# Patient Record
Sex: Female | Born: 1955
Health system: Southern US, Community
[De-identification: ages and names within clinical notes are randomized; demographics above are authoritative.]

## PROBLEM LIST (undated history)

## (undated) DIAGNOSIS — E785 Hyperlipidemia, unspecified: Secondary | ICD-10-CM

## (undated) DIAGNOSIS — Z98811 Dental restoration status: Secondary | ICD-10-CM

## (undated) DIAGNOSIS — R079 Chest pain, unspecified: Secondary | ICD-10-CM

## (undated) DIAGNOSIS — Q231 Congenital insufficiency of aortic valve: Secondary | ICD-10-CM

## (undated) DIAGNOSIS — I359 Nonrheumatic aortic valve disorder, unspecified: Secondary | ICD-10-CM

## (undated) DIAGNOSIS — M65342 Trigger finger, left ring finger: Secondary | ICD-10-CM

## (undated) DIAGNOSIS — T7840XA Allergy, unspecified, initial encounter: Secondary | ICD-10-CM

## (undated) DIAGNOSIS — I10 Essential (primary) hypertension: Secondary | ICD-10-CM

## (undated) DIAGNOSIS — I2510 Atherosclerotic heart disease of native coronary artery without angina pectoris: Secondary | ICD-10-CM

## (undated) DIAGNOSIS — Q2381 Bicuspid aortic valve: Secondary | ICD-10-CM

## (undated) DIAGNOSIS — F419 Anxiety disorder, unspecified: Secondary | ICD-10-CM

## (undated) HISTORY — DX: Allergy, unspecified, initial encounter: T78.40XA

## (undated) HISTORY — DX: Hyperlipidemia, unspecified: E78.5

## (undated) HISTORY — DX: Anxiety disorder, unspecified: F41.9

## (undated) HISTORY — DX: Essential (primary) hypertension: I10

## (undated) HISTORY — PX: CHOLECYSTECTOMY: SHX55

## (undated) HISTORY — PX: ROTATOR CUFF REPAIR: SHX139

## (undated) HISTORY — PX: ABDOMINAL HYSTERECTOMY: SHX81

## (undated) HISTORY — PX: APPENDECTOMY: SHX54

---

## 1998-08-25 ENCOUNTER — Ambulatory Visit (HOSPITAL_COMMUNITY): Admission: RE | Admit: 1998-08-25 | Discharge: 1998-08-25 | Payer: Self-pay | Admitting: Surgery

## 1998-11-25 ENCOUNTER — Other Ambulatory Visit: Admission: RE | Admit: 1998-11-25 | Discharge: 1998-11-25 | Payer: Self-pay | Admitting: Obstetrics and Gynecology

## 2001-12-29 ENCOUNTER — Other Ambulatory Visit: Admission: RE | Admit: 2001-12-29 | Discharge: 2001-12-29 | Payer: Self-pay | Admitting: Obstetrics and Gynecology

## 2003-08-29 ENCOUNTER — Emergency Department (HOSPITAL_COMMUNITY): Admission: EM | Admit: 2003-08-29 | Discharge: 2003-08-29 | Payer: Self-pay | Admitting: Emergency Medicine

## 2004-04-09 ENCOUNTER — Ambulatory Visit (HOSPITAL_BASED_OUTPATIENT_CLINIC_OR_DEPARTMENT_OTHER): Admission: RE | Admit: 2004-04-09 | Discharge: 2004-04-09 | Payer: Self-pay | Admitting: Orthopedic Surgery

## 2004-04-09 HISTORY — PX: FOOT GANGLION EXCISION: SHX1660

## 2004-04-28 ENCOUNTER — Observation Stay (HOSPITAL_COMMUNITY): Admission: RE | Admit: 2004-04-28 | Discharge: 2004-04-29 | Payer: Self-pay | Admitting: Surgery

## 2004-04-28 HISTORY — PX: INCISIONAL HERNIA REPAIR: SHX193

## 2005-04-14 ENCOUNTER — Other Ambulatory Visit: Admission: RE | Admit: 2005-04-14 | Discharge: 2005-04-14 | Payer: Self-pay | Admitting: Obstetrics and Gynecology

## 2006-02-02 ENCOUNTER — Ambulatory Visit (HOSPITAL_BASED_OUTPATIENT_CLINIC_OR_DEPARTMENT_OTHER): Admission: RE | Admit: 2006-02-02 | Discharge: 2006-02-02 | Payer: Self-pay | Admitting: Orthopedic Surgery

## 2006-02-02 HISTORY — PX: OLECRANON BURSA EXCISION: SUR541

## 2006-07-13 ENCOUNTER — Ambulatory Visit (HOSPITAL_BASED_OUTPATIENT_CLINIC_OR_DEPARTMENT_OTHER): Admission: RE | Admit: 2006-07-13 | Discharge: 2006-07-13 | Payer: Self-pay | Admitting: Orthopedic Surgery

## 2006-07-13 HISTORY — PX: MINOR IRRIGATION AND DEBRIDEMENT OF WOUND: SHX6239

## 2007-04-07 ENCOUNTER — Encounter: Payer: Self-pay | Admitting: Cardiovascular Disease

## 2007-04-21 ENCOUNTER — Encounter: Payer: Self-pay | Admitting: Cardiovascular Disease

## 2008-05-22 ENCOUNTER — Encounter: Admission: RE | Admit: 2008-05-22 | Discharge: 2008-05-22 | Payer: Self-pay | Admitting: Surgery

## 2009-01-01 ENCOUNTER — Encounter: Admission: RE | Admit: 2009-01-01 | Discharge: 2009-01-01 | Payer: Self-pay | Admitting: Obstetrics and Gynecology

## 2009-05-06 ENCOUNTER — Ambulatory Visit: Payer: Self-pay | Admitting: Internal Medicine

## 2009-05-30 ENCOUNTER — Ambulatory Visit: Payer: Self-pay | Admitting: Internal Medicine

## 2010-06-11 ENCOUNTER — Ambulatory Visit: Payer: Self-pay | Admitting: Cardiovascular Disease

## 2011-01-01 NOTE — Op Note (Signed)
NAMETarini, Erin [name]                  ACCOUNT NO.:  [id number]   MEDICAL RECORD NO.:  [id number]          PATIENT TYPE:  AMB   LOCATION:  DSC                          FACILITY:  [facility]   PHYSICIAN:  Loreta Ave, M.D. DATE OF BIRTH:  1955/10/12   DATE OF PROCEDURE:  07/13/2006  DATE OF DISCHARGE:                               OPERATIVE REPORT   PREOPERATIVE DIAGNOSIS:  Persistent stitch abscess, left elbow, after  olecranon bursectomy performed June 2007.   POSTOPERATIVE DIAGNOSIS:  Persistent stitch abscess, left elbow, after  olecranon bursectomy performed June 2007, with minimal amount of  purulence, but an abundant amount of subcutaneous scar and granulation  tissue.   PROCEDURES PERFORMED:  Exploration, drainage, debridement of left elbow  wound with excision of stitch abscess and sinus tract.  Irrigation and  loose primary closure.   SURGEON:  Loreta Ave, M.D.   ASSISTANT:  Zonia Kief, P.A.   ANESTHESIA:  General.   ESTIMATED BLOOD LOSS:  Minimal.   TOURNIQUET TIME:  30 minutes.   SPECIMENS:  None.   CULTURES:  Aerobic and anaerobic culture of the scant amount of purulent  material was sent.   DRESSINGS:  Soft compressive.   COMPLICATIONS:  None.   DESCRIPTION OF PROCEDURE:  The patient was brought to the operating room  and after adequate anesthesia had been obtained, the left arm was  prepped and draped in the usual sterile fashion.  An elliptical incision  of the sinus tract yielding a scant amount of about 1 mL of purulent  material and retained stitch that never resorbed.  The arm was  exsanguinated and tourniquet inflated to allow visualization.  The  proximal half of the wound was opened.  Although a lot of subcutaneous  granulation tissue was found with adhesions, there was no further  purulence.  Nothing deep.  All of the abnormal tissue was completely  excised.  The margins of the wound were debrided back to  healthy tissue.  Thoroughly  irrigated with saline.  Loose primary  closure with nylon.  A sterile compressive dressing was applied.  Tourniquet was deflated and removed.  Anesthesia reversed.  Brought to  the recovery room.  Tolerated the surgery with no complications.      Loreta Ave, M.D.  Electronically Signed     DFM/MEDQ  D:  07/13/2006  T:  07/14/2006  Job:  [id number]

## 2011-01-01 NOTE — Op Note (Signed)
NAMEBrianna [name], Erin [name]                            ACCOUNT NO.:  [id number]   MEDICAL RECORD NO.:  [id number]                   PATIENT TYPE:  AMB   LOCATION:  DSC                                  FACILITY:  [facility]   PHYSICIAN:  Loreta Ave, M.D.              DATE OF BIRTH:  [birth year]   DATE OF PROCEDURE:  04/09/2004  DATE OF DISCHARGE:                                 OPERATIVE REPORT   PREOPERATIVE DIAGNOSIS:  Symptomatic ganglion, dorsal lateral aspect of left  foot.   POSTOPERATIVE DIAGNOSIS:  Ganglion arising from the fourth tarsometatarsal  joint dorsally.   PROCEDURE:  Excision of ganglion dorsal lateral aspect of left foot.   SURGEON:  Loreta Ave, M.D.   ASSISTANT:  Arlys John D. Petrarca, P.A.-C.   ANESTHESIA:  General.   ESTIMATED BLOOD LOSS:  Minimal.   TOURNIQUET TIME:  30 minutes.   SPECIMENS:  Excised ganglion.   CULTURES:  None.   COMPLICATIONS:  None.   DRESSING:  Sterile compressive dressing.   DESCRIPTION OF PROCEDURE:  The patient was brought to the operating room and  placed on the operative table in the supine position.  After adequate  anesthesia had been obtained, the tourniquet was applied and prepped and  draped in the usual sterile fashion.  Exsanguination with elevation of the  Esmarch and the Esmarch was left as a tourniquet at the ankle.  The ganglion  which was 2 cm in diameter coming off the dorsal lateral aspect of the foot  was approached through an oblique incision in line with the skin folds.  The  skin and subcutaneous tissues were divided.  Careful dissection of the  ganglion protecting the neurovascular structures. There had been  preoperative irritation of the sural nerve which was at the margin of the  ganglion, protected, identified and not injured.  The ganglion was excised  in its entirety tracking all the way down to the joint capsule between the  fourth tarsometatarsal joint.  A small window was made in the capsule so the  neck of the ganglion was removed.  All recesses were examined to be sure all  fragments were removed.  The wound was irrigated.  The wound was closed with  Vicryl and  then nylon.  It was then injected with Marcaine and a sterile compressive  dressing was applied.  The Esmarch was removed and the anesthesia was  reversed.  The patient was brought to the recovery room and tolerated the  surgery well with no complications.                                               Loreta Ave, M.D.    DFM/MEDQ  D:  04/09/2004  T:  04/09/2004  Job:  [id number]

## 2011-01-01 NOTE — Op Note (Signed)
NAMETyffani, Foglesong Chiquita                  ACCOUNT NO.:  [id number]   MEDICAL RECORD NO.:  [id number]          PATIENT TYPE:  AMB   LOCATION:  DSC                          FACILITY:  [facility]   PHYSICIAN:  Loreta Ave, M.D. DATE OF BIRTH:  [birth year]   DATE OF PROCEDURE:  02/02/2006  DATE OF DISCHARGE:                                 OPERATIVE REPORT   PREOPERATIVE DIAGNOSIS:  Olecranon bursitis with chondral loose bodies in  the olecranon bursa, left elbow.   POSTOPERATIVE DIAGNOSIS:  Olecranon bursitis with chondral loose bodies in  the olecranon bursa, left elbow.   OPERATIVE PROCEDURE:  Excision of olecranon bursa, left elbow.   SURGEON:  Loreta Ave, M.D.   ASSISTANT:  Genene Churn. Denton Meek.   ANESTHESIA:  General.   ESTIMATED BLOOD LOSS:  Minimal.   TOURNIQUET TIME:  40 minutes.   SPECIMENS:  None.   CULTURES:  None.   COMPLICATIONS:  None.   DRESSING:  Soft compressive.   PROCEDURE:  Patient brought to the operating room and after adequate  anesthesia had been obtained, tourniquet applied to the upper aspect of the  left arm.  Prepped and draped in the usual sterile fashion.  Exsanguinated  with elevation and Esmarch, tourniquet inflated to 250 mmHg.  A longitudinal  incision curved medially around the tip of the elbow.  Skin and subcutaneous  tissue divided.  The ulnar nerve identified and protected throughout.  The  bursa, which was not that swollen today, was excised in its entirety,  including the fibrinous and chondral debris within the bursa.  Once  completely excised, the entire area was explored.  Triceps tendon intact.  A  little prominence and roughness of the olecranon underneath this, which was  smoothed off with rongeurs to a nice smooth surface so there  would be nothing prominent.  Wound irrigated and then closed with  subcutaneous and subcuticular Vicryl and Steri-Strips.  Sterile compressive  dressing with a compressive wrap applied.  Tourniquet  deflated.  Anesthesia  reversed.  Brought to the recovery room.  Tolerated the surgery well with no  complications.      Loreta Ave, M.D.  Electronically Signed     DFM/MEDQ  D:  02/02/2006  T:  02/02/2006  Job:  [id number]

## 2011-01-01 NOTE — Op Note (Signed)
NAMEJanequa, Erin [name]                            ACCOUNT NO.:  [id number]   MEDICAL RECORD NO.:  [id number]                   PATIENT TYPE:  OBV   LOCATION:  0098                                 FACILITY:  [facility]   PHYSICIAN:  Thornton Park. Daphine Deutscher, M.D.             DATE OF BIRTH:  10/01/1955   DATE OF PROCEDURE:  04/28/2004  DATE OF DISCHARGE:                                 OPERATIVE REPORT   PREOPERATIVE DIAGNOSIS:  Right sided right upper quadrant abdominal pain in  the site of previous incision.   POSTOPERATIVE DIAGNOSIS:  Right upper quadrant ventral incisional hernia.   PROCEDURE:  Laparoscopic enterolysis and laparoscopic repair of hernia with  Pyratec mesh.   SURGEON:  Thornton Park. Daphine Deutscher, M.D.   ANESTHESIA:  General.   DESCRIPTION OF PROCEDURE:  Erin [name] was taken to room 6 at [facility] on  April 28, 2004, and given general anesthesia.  The abdomen was entered  using an Optiview trocar with 0 degree 10 mm scope through the left upper  quadrant in an oblique fashion.  Pneumoperitoneum was established.  Using  the Harmonic scalpel and two more 5 mm ports in the lower abdomen, I  proceeded to do an enterolysis of adhesions to a previous mesh repair in the  midline.  As I took down the adhesions, I saw an area that appeared to be  torn into the muscle in the lateral most part of the wound exactly where she  hurt.  I felt this was a likely source of her pain.  In addition, in that  area, I took down some adhesions using the Harmonic scalpel between the  colon and small bowel which were nice and clean cut, easy to see adhesions.  I used some Pyratec mesh and cut it into a circle.  It was 3 inches in  diameter.  I then fixed four sutures at the perimeter and one in the middle.  I then proceeded to mark these on the skin and then introduced the Pyratec  mesh after soaking it and then pulling up the 0 Prolene sutures and thing  them down.  The remaining portion of the mesh which  lay in there nicely was  tacked with a tacker.  Everything else in the abdomen appeared to be in  order.  The abdomen was deflated and the trocars withdrawn.  The patient  tolerated the procedure well and was taken to the recovery room in  satisfactory condition.  She will be admitted for overnight observation.                                               Thornton Park Daphine Deutscher, M.D.    MBM/MEDQ  D:  04/28/2004  T:  04/28/2004  Job:  [id number]

## 2011-06-21 ENCOUNTER — Encounter: Payer: Self-pay | Admitting: Cardiovascular Disease

## 2011-06-23 ENCOUNTER — Encounter: Payer: Self-pay | Admitting: Cardiovascular Disease

## 2011-06-23 ENCOUNTER — Ambulatory Visit (INDEPENDENT_AMBULATORY_CARE_PROVIDER_SITE_OTHER): Payer: 59 | Admitting: Cardiovascular Disease

## 2011-06-23 DIAGNOSIS — E785 Hyperlipidemia, unspecified: Secondary | ICD-10-CM

## 2011-06-23 DIAGNOSIS — R011 Cardiac murmur, unspecified: Secondary | ICD-10-CM

## 2011-06-23 MED ORDER — ROSUVASTATIN CALCIUM 5 MG PO TABS: 5.0000 mg | ORAL_TABLET | Freq: Every day | ORAL | Status: DC

## 2011-06-23 NOTE — Progress Notes (Signed)
Erin [name] Date of Birth  02-14-1956  [facility] [address]    [address] [place], [place]  [address] [phone]  Fax  [phone]  History of Present Illness:  Erin [name] is a 55 with a hx of hyperlipidemia and a functional bicuspid aortic valve.  She has done well. Denies any chest pain or dyspnea.    Current Outpatient Prescriptions on File Prior to Visit  Medication Sig Dispense Refill  . aspirin 81 MG tablet Take 81 mg by mouth. Twice a Week       . atorvastatin (LIPITOR) 20 MG tablet Take 20 mg by mouth daily.          No Known Allergies  Past Medical History  Diagnosis Date  . Chest pain     nagetive stress test 04/21/2007  . Hyperlipidemia   . Hypertension   . Bicuspid aortic valve     Functional bicuspid aortic valve    No past surgical history on file.  History  Smoking status  . Former Smoker  . Quit date: 06/11/2010  Smokeless tobacco  . Not on file    History  Alcohol Use No    No family history on file.  Reviw of Systems:  Reviewed in the HPI.  All other systems are negative.  Physical Exam: BP 134/82  Pulse 73  Ht 5\' 3"  (1.6 m)  Wt 136 lb 6.4 oz (61.871 kg)  BMI 24.16 kg/m2 The patient is alert and oriented x 3.  The mood and affect are normal.   Skin: warm and dry.  Color is normal.    HEENT:   Normocephalic/atraumatic. The mucous membranes are moist. Her carotids are 2+ without bruits.  Lungs: Her lungs are clear.   Heart: Regular rate, S1-S2. I did not hear a murmur today   Abdomen: + BS, non tender, no HSM  Extremities:  No c/c/e  Neuro:  Non focal, gait is normal.    ECG: Normal sinus rhythm, normal EKG  Assessment / Plan:

## 2011-06-23 NOTE — Assessment & Plan Note (Addendum)
She's had lots of muscle aches ever since she started Lipitor. We'll try her on Crestor 5 mg a day. We'll check her labs in 3 months. I'll see her again in 1 year.

## 2011-06-23 NOTE — Patient Instructions (Addendum)
Your physician recommends that you return for a FASTING lipid profile: 3 months/ app set   Your physician wants you to follow-up in: 12  months  You will receive a reminder letter in the mail two months in advance. If you don't receive a letter, please call our office to schedule the follow-up appointment.   Your physician has recommended you make the following change in your medication:   1) stop lipitor  2) start crestor 5 mg

## 2011-09-28 ENCOUNTER — Other Ambulatory Visit (INDEPENDENT_AMBULATORY_CARE_PROVIDER_SITE_OTHER): Payer: 59 | Admitting: *Deleted

## 2011-09-28 DIAGNOSIS — E785 Hyperlipidemia, unspecified: Secondary | ICD-10-CM

## 2011-09-28 LAB — LIPID PANEL
Cholesterol: 199 mg/dL (ref 0–200)
HDL: 54.4 mg/dL (ref >39.00)
Triglycerides: 95 mg/dL (ref 0.0–149.0)
VLDL: 19 mg/dL (ref 0.0–40.0)

## 2011-09-28 LAB — HEPATIC FUNCTION PANEL
ALT: 20 U/L (ref 0–35)
AST: 18 U/L (ref 0–37)
Albumin: 3.9 g/dL (ref 3.5–5.2)
Alkaline Phosphatase: 105 U/L (ref 39–117)
Total Bilirubin: 0.5 mg/dL (ref 0.3–1.2)
Total Protein: 6.8 g/dL (ref 6.0–8.3)

## 2011-09-28 LAB — BASIC METABOLIC PANEL
BUN: 16 mg/dL (ref 6–23)
Calcium: 9.2 mg/dL (ref 8.4–10.5)

## 2011-10-21 ENCOUNTER — Telehealth: Payer: Self-pay | Admitting: *Deleted

## 2011-10-21 NOTE — Telephone Encounter (Signed)
Patient called with lab results. msg left 

## 2013-07-06 ENCOUNTER — Ambulatory Visit (INDEPENDENT_AMBULATORY_CARE_PROVIDER_SITE_OTHER): Payer: 59 | Admitting: Cardiovascular Disease

## 2013-07-06 ENCOUNTER — Encounter: Payer: Self-pay | Admitting: Cardiovascular Disease

## 2013-07-06 VITALS — BP 142/80 | HR 65 | Ht 63.0 in | Wt 142.0 lb

## 2013-07-06 DIAGNOSIS — I359 Nonrheumatic aortic valve disorder, unspecified: Secondary | ICD-10-CM

## 2013-07-06 DIAGNOSIS — E785 Hyperlipidemia, unspecified: Secondary | ICD-10-CM

## 2013-07-06 DIAGNOSIS — I350 Nonrheumatic aortic (valve) stenosis: Secondary | ICD-10-CM | POA: Insufficient documentation

## 2013-07-06 DIAGNOSIS — R0989 Other specified symptoms and signs involving the circulatory and respiratory systems: Secondary | ICD-10-CM

## 2013-07-06 NOTE — Assessment & Plan Note (Signed)
  Erin [name]  has a history of very mild aortic stenosis. It appears that she has fusion of 2 of her cusp but not a true bicuspid aortic valve.  We'll get a repeat echocardiogram for further evaluation. She's not having any symptoms at this point.

## 2013-07-06 NOTE — Assessment & Plan Note (Signed)
He has a history of cigarette smoking. She now has a right carotid bruit. We will check a carotid duplex scan. Her cholesterol levels have been managed by Dr. Waynard Edwards

## 2013-07-06 NOTE — Progress Notes (Signed)
Erin [name] Date of Birth  Dec 22, 1955 [facility] [address]    [address] [place], [place]  [address] [phone]  Fax  [phone]  History of Present Illness:  Erin [name] is a 89 with a hx of hyperlipidemia and a functional bicuspid aortic valve.  She has done well. Denies any chest pain or dyspnea.    Nov. 21, 2014:  Erin [name] is ding ok.  She is taking Crestor and not the mevacor as listed in med list.  No CP,  Anxious about her BP;.  She avoids salt .  BP is typicall 140s / 80's.   Current Outpatient Prescriptions  Medication Sig Dispense Refill  . aspirin 81 MG tablet Take 81 mg by mouth. Twice a Week       . rosuvastatin (CRESTOR) 20 MG tablet Take 20 mg by mouth daily.      Marland Kitchen lovastatin (MEVACOR) 40 MG tablet Take 40 mg by mouth at bedtime.       No current facility-administered medications for this visit.     Allergies  Allergen Reactions  . Lipitor [Atorvastatin Calcium]     Muscle aches    Past Medical History  Diagnosis Date  . Chest pain     nagetive stress test 04/21/2007  . Hyperlipidemia   . Hypertension   . Bicuspid aortic valve     Functional bicuspid aortic valve    No past surgical history on file.  History  Smoking status  . Former Smoker  . Quit date: 06/11/2010  Smokeless tobacco  . Not on file    History  Alcohol Use No    No family history on file.  Reviw of Systems:  Reviewed in the HPI.  All other systems are negative.  Physical Exam: BP 142/80  Pulse 65  Ht 5\' 3"  (1.6 m)  Wt 142 lb (64.411 kg)  BMI 25.16 kg/m2 The patient is alert and oriented x 3.  The mood and affect are normal.   Skin: warm and dry.  Color is normal.    HEENT:   Normocephalic/atraumatic. The mucous membranes are moist. Her carotids are 2+ without bruits.  Lungs: Her lungs are clear.   Heart: Regular rate, S1-S2. I did not hear a murmur today   Abdomen: + BS, non tender, no HSM  Extremities:  No c/c/e  Neuro:  Non focal, gait is normal.     ECG: Nov. 21, 2014:  Normal sinus rhythm at 65 , normal EKG  Assessment / Plan:

## 2013-07-06 NOTE — Patient Instructions (Signed)
Your physician has requested that you have an echocardiogram. Echocardiography is a painless test that uses sound waves to create images of your heart. It provides your doctor with information about the size and shape of your heart and how well your heart's chambers and valves are working. This procedure takes approximately one hour. There are no restrictions for this procedure.  Your physician has requested that you have a carotid duplex. This test is an ultrasound of the carotid arteries in your neck. It looks at blood flow through these arteries that supply the brain with blood. Allow one hour for this exam. There are no restrictions or special instructions.   Your physician wants you to follow-up in: 1 year You will receive a reminder letter in the mail two months in advance. If you don't receive a letter, please call our office to schedule the follow-up appointment.   Your physician recommends that you continue on your current medications as directed. Please refer to the Current Medication list given to you today.

## 2013-07-06 NOTE — Assessment & Plan Note (Signed)
Managed by Dr. Perini. 

## 2013-07-17 ENCOUNTER — Ambulatory Visit (HOSPITAL_COMMUNITY): Payer: 59 | Attending: Cardiovascular Disease

## 2013-07-17 DIAGNOSIS — Q251 Coarctation of aorta: Secondary | ICD-10-CM | POA: Insufficient documentation

## 2013-07-17 DIAGNOSIS — I6529 Occlusion and stenosis of unspecified carotid artery: Secondary | ICD-10-CM

## 2013-07-17 DIAGNOSIS — R0989 Other specified symptoms and signs involving the circulatory and respiratory systems: Secondary | ICD-10-CM

## 2013-07-17 DIAGNOSIS — E785 Hyperlipidemia, unspecified: Secondary | ICD-10-CM

## 2013-07-17 DIAGNOSIS — I350 Nonrheumatic aortic (valve) stenosis: Secondary | ICD-10-CM

## 2013-07-23 ENCOUNTER — Ambulatory Visit (HOSPITAL_COMMUNITY): Payer: 59 | Attending: Cardiovascular Disease | Admitting: Radiology

## 2013-07-23 DIAGNOSIS — Z87891 Personal history of nicotine dependence: Secondary | ICD-10-CM | POA: Insufficient documentation

## 2013-07-23 DIAGNOSIS — I359 Nonrheumatic aortic valve disorder, unspecified: Secondary | ICD-10-CM | POA: Insufficient documentation

## 2013-07-23 DIAGNOSIS — I379 Nonrheumatic pulmonary valve disorder, unspecified: Secondary | ICD-10-CM | POA: Insufficient documentation

## 2013-07-23 DIAGNOSIS — E785 Hyperlipidemia, unspecified: Secondary | ICD-10-CM | POA: Insufficient documentation

## 2013-07-23 DIAGNOSIS — I059 Rheumatic mitral valve disease, unspecified: Secondary | ICD-10-CM | POA: Insufficient documentation

## 2013-07-23 DIAGNOSIS — Q231 Congenital insufficiency of aortic valve: Secondary | ICD-10-CM | POA: Insufficient documentation

## 2013-07-23 DIAGNOSIS — I350 Nonrheumatic aortic (valve) stenosis: Secondary | ICD-10-CM

## 2013-07-23 DIAGNOSIS — I079 Rheumatic tricuspid valve disease, unspecified: Secondary | ICD-10-CM | POA: Insufficient documentation

## 2013-07-23 DIAGNOSIS — R079 Chest pain, unspecified: Secondary | ICD-10-CM | POA: Insufficient documentation

## 2013-07-23 DIAGNOSIS — R0989 Other specified symptoms and signs involving the circulatory and respiratory systems: Secondary | ICD-10-CM

## 2013-07-23 NOTE — Progress Notes (Signed)
Echocardiogram performed.  

## 2013-10-23 ENCOUNTER — Encounter: Payer: Self-pay | Admitting: Cardiovascular Disease

## 2014-07-09 ENCOUNTER — Ambulatory Visit (INDEPENDENT_AMBULATORY_CARE_PROVIDER_SITE_OTHER): Payer: 59 | Admitting: Cardiovascular Disease

## 2014-07-09 ENCOUNTER — Encounter: Payer: Self-pay | Admitting: Cardiovascular Disease

## 2014-07-09 VITALS — BP 164/96 | HR 64 | Ht 63.0 in | Wt 134.4 lb

## 2014-07-09 DIAGNOSIS — R0989 Other specified symptoms and signs involving the circulatory and respiratory systems: Secondary | ICD-10-CM

## 2014-07-09 DIAGNOSIS — I350 Nonrheumatic aortic (valve) stenosis: Secondary | ICD-10-CM

## 2014-07-09 DIAGNOSIS — E785 Hyperlipidemia, unspecified: Secondary | ICD-10-CM

## 2014-07-09 NOTE — Progress Notes (Signed)
Erin [name] Date of Birth  [birth year] [facility] [address]    [place], [place]  [address] [phone]  Fax  [phone]  History of Present Illness:  Erin [name] is a 43 with a hx of hyperlipidemia and a functional bicuspid aortic valve.  She has done well. Denies any chest pain or dyspnea.    Nov. 21, 2014:  Erin [name] is ding ok.  She is taking Crestor and not the mevacor as listed in med list.  No CP,  Anxious about her BP;.  She avoids salt .  BP is typicall 140s / 80's.  Nov. 24, 2015:  BP is a bit high.   Not eating any extra salt.   Has been taking some cold meds recently.  Has been exercising twice a week.   Boot camp.  No CP or dyspnea.   Current Outpatient Prescriptions  Medication Sig Dispense Refill  . aspirin 81 MG tablet Take 81 mg by mouth. Twice a Week     . benazepril (LOTENSIN) 40 MG tablet Take 40 mg by mouth daily.    . rosuvastatin (CRESTOR) 20 MG tablet Take 20 mg by mouth daily.     No current facility-administered medications for this visit.     Allergies  Allergen Reactions  . Lipitor [Atorvastatin Calcium]     Muscle aches    Past Medical History  Diagnosis Date  . Chest pain     nagetive stress test 04/21/2007  . Hyperlipidemia   . Hypertension   . Bicuspid aortic valve     Functional bicuspid aortic valve    No past surgical history on file.  History  Smoking status  . Former Smoker  . Quit date: 06/11/2010  Smokeless tobacco  . Not on file    History  Alcohol Use No    No family history on file.  Reviw of Systems:  Reviewed in the HPI.  All other systems are negative.  Physical Exam: BP 164/96 mmHg  Pulse 64  Ht 5\' 3"  (1.6 m)  Wt 134 lb 6.4 oz (60.963 kg)  BMI 23.81 kg/m2 The patient is alert and oriented x 3.  The mood and affect are normal.   Skin: warm and dry.  Color is normal.    HEENT:   Normocephalic/atraumatic. The mucous membranes are moist. Her carotids are 2+ without bruits.  Lungs: Her  lungs are clear.   Heart: Regular rate, S1-S2. I did not hear a murmur today   Abdomen: + BS, non tender, no HSM  Extremities:  No c/c/e  Neuro:  Non focal, gait is normal.    ECG: Nov. 24, 2015  :  NSR at 43. Possible LAE.   Assessment / Plan:

## 2014-07-09 NOTE — Assessment & Plan Note (Signed)
She has only mild carotid plaque. She has not significant bruitt on exam. OK to wait until next year to do her follow up  Carotid Duplex

## 2014-07-09 NOTE — Assessment & Plan Note (Signed)
Her last lipid profile was at Dr. Silvestre Mesi office.   He increased her crestor. He will follow up with labs again this March.

## 2014-07-09 NOTE — Patient Instructions (Signed)
Your physician recommends that you continue on your current medications as directed. Please refer to the Current Medication list given to you today.  Your physician wants you to follow-up in: 1 year with Dr. Nahser.  You will receive a reminder letter in the mail two months in advance. If you don't receive a letter, please call our office to schedule the follow-up appointment.  

## 2015-02-03 ENCOUNTER — Encounter: Payer: Self-pay | Admitting: Cardiovascular Disease

## 2015-07-16 ENCOUNTER — Encounter: Payer: Self-pay | Admitting: Cardiovascular Disease

## 2015-07-16 ENCOUNTER — Ambulatory Visit (INDEPENDENT_AMBULATORY_CARE_PROVIDER_SITE_OTHER): Payer: 59 | Admitting: Cardiovascular Disease

## 2015-07-16 VITALS — BP 134/100 | HR 59 | Ht 63.0 in | Wt 121.6 lb

## 2015-07-16 DIAGNOSIS — E785 Hyperlipidemia, unspecified: Secondary | ICD-10-CM

## 2015-07-16 DIAGNOSIS — R0989 Other specified symptoms and signs involving the circulatory and respiratory systems: Secondary | ICD-10-CM | POA: Diagnosis not present

## 2015-07-16 NOTE — Progress Notes (Signed)
Erin [name] Date of Birth  28-Dec-1955 [facility] [address]    [place], [place]  [address] [phone]  Fax  [phone]  Problem list 1. Hyperlipidemia 2. Functional bicuspid aortic valve  History of Present Illness:  Erin [name] is a 58 with a hx of hyperlipidemia and a functional bicuspid aortic valve.  She has done well. Denies any chest pain or dyspnea.    Nov. 21, 2014:  Erin [name] is ding ok.  She is taking Crestor and not the mevacor as listed in med list.  No CP,  Anxious about her BP;.  She avoids salt .  BP is typicall 140s / 80's.  Nov. 24, 2015:  BP is a bit high.   Not eating any extra salt.   Has been taking some cold meds recently.  Has been exercising twice a week.   Boot camp.  No CP or dyspnea.   Nov. 30,  2016: Doing great. Boot camp twice a week .  Gets her labs at Dr. Silvestre Mesi office.    Current Outpatient Prescriptions  Medication Sig Dispense Refill  . aspirin 81 MG tablet Take 81 mg by mouth. Twice a Week     . benazepril (LOTENSIN) 40 MG tablet Take 40 mg by mouth daily.    . rosuvastatin (CRESTOR) 20 MG tablet Take 20 mg by mouth daily.    . valACYclovir (VALTREX) 1000 MG tablet Take 1 g by mouth daily as needed. AS NEEDED FOR COLD SORES     No current facility-administered medications for this visit.     Allergies  Allergen Reactions  . Lipitor [Atorvastatin Calcium]     Muscle aches    Past Medical History  Diagnosis Date  . Chest pain     nagetive stress test 04/21/2007  . Hyperlipidemia   . Hypertension   . Bicuspid aortic valve     Functional bicuspid aortic valve    No past surgical history on file.  History  Smoking status  . Former Smoker  . Quit date: 06/11/2010  Smokeless tobacco  . Not on file    History  Alcohol Use No    No family history on file.  Reviw of Systems:  Reviewed in the HPI.  All other systems are negative.  Physical Exam: BP 134/100 mmHg  Pulse 59  Ht 5\' 3"  (1.6 m)  Wt 121  lb 9.6 oz (55.157 kg)  BMI 21.55 kg/m2 The patient is alert and oriented x 3.  The mood and affect are normal.   Skin: warm and dry.  Color is normal.    HEENT:   Normocephalic/atraumatic. The mucous membranes are moist. Her carotids are 2+ without bruits.  Lungs: Her lungs are clear.   Heart: Regular rate, S1-S2. I did not hear a murmur today   Abdomen: + BS, non tender, no HSM  Extremities:  No c/c/e  Neuro:  Non focal, gait is normal.    ECG: Nov. 30, 2016:  Sinus brady at 1.  Normal ECG   Assessment / Plan:   1 . Carotid artery disease:  has a moderate bilateral carotid artery disease by Duplex scan.   Will repeat carotid duplex scan . Continue aggressive lipid control   2. Hyperlipidemia.   Continue Nutritional therapist .   Labs from Dr. Joylene Draft look ok.   Her LDL is 100. She has mild carotid artery disease so her goal LDL should be around 70. Encouraged her to eats less fat in her diet  We should consider adding Zetia to her medical regimine  Will see her in 1 year.     Luci Bellucci, Wonda Cheng, MD  07/16/2015 9:14 AM    [facility],  [place], [place]  [address] Pager [phone] Phone: [phone]; Fax: [phone]   [facility]  [address] [place], [place]  [address] [phone]   Fax [phone]

## 2015-07-16 NOTE — Patient Instructions (Signed)
Medication Instructions:   Your physician recommends that you continue on your current medications as directed. Please refer to the Current Medication list given to you today.   Testing/Procedures:  Your physician has requested that you have a carotid duplex. This test is an ultrasound of the carotid arteries in your neck. It looks at blood flow through these arteries that supply the brain with blood. Allow one hour for this exam. There are no restrictions or special instructions.    Follow-Up:  Your physician wants you to follow-up in: [place] will receive a reminder letter in the mail two months in advance. If you don't receive a letter, please call our office to schedule the follow-up appointment.     If you need a refill on your cardiac medications before your next appointment, please call your pharmacy.

## 2015-07-22 ENCOUNTER — Ambulatory Visit (HOSPITAL_COMMUNITY)
Admission: RE | Admit: 2015-07-22 | Discharge: 2015-07-22 | Disposition: A | Discharge status: Home / Self Care | Payer: 59 | Source: Ambulatory Visit | Attending: Cardiology | Admitting: Cardiology

## 2015-07-22 DIAGNOSIS — I6523 Occlusion and stenosis of bilateral carotid arteries: Secondary | ICD-10-CM | POA: Insufficient documentation

## 2015-07-22 DIAGNOSIS — E785 Hyperlipidemia, unspecified: Secondary | ICD-10-CM | POA: Diagnosis not present

## 2015-07-22 DIAGNOSIS — R0989 Other specified symptoms and signs involving the circulatory and respiratory systems: Secondary | ICD-10-CM | POA: Diagnosis not present

## 2015-07-22 DIAGNOSIS — I10 Essential (primary) hypertension: Secondary | ICD-10-CM | POA: Diagnosis not present

## 2015-10-15 DIAGNOSIS — M65342 Trigger finger, left ring finger: Secondary | ICD-10-CM

## 2015-10-15 HISTORY — DX: Trigger finger, left ring finger: M65.342

## 2015-11-06 ENCOUNTER — Encounter (HOSPITAL_BASED_OUTPATIENT_CLINIC_OR_DEPARTMENT_OTHER): Payer: Self-pay | Admitting: *Deleted

## 2015-11-10 ENCOUNTER — Other Ambulatory Visit: Payer: Self-pay | Admitting: Physician Assistant

## 2015-11-10 NOTE — H&P (Signed)
  Erin [name] comes in for follow up.  Recurrent triggering ring finger, left hand.  Injection lasted about eight weeks.  Recurrent triggering since then.  More and more intolerable.  She would like to discuss definitive treatment.   History and general exam is reviewed.    EXAMINATION: Lungs clear to auscultation bilaterally.  Heart sounds normal.  Specifically, obvious recurrent triggering A1 pulley, left ring finger.  Neurovascularly intact.    DISPOSITION:  We have talked about definitive treatment with an A1 pulley release.  I don't think further shots are going to help.  She understands and agrees.  Paperwork complete.  Set this up in the near future.    Ninetta Lights, M.D.

## 2015-11-13 ENCOUNTER — Encounter (HOSPITAL_BASED_OUTPATIENT_CLINIC_OR_DEPARTMENT_OTHER)
Admission: RE | Disposition: A | Discharge status: Home / Self Care | Payer: Self-pay | Source: Ambulatory Visit | Attending: Orthopedic Surgery

## 2015-11-13 ENCOUNTER — Ambulatory Visit (HOSPITAL_BASED_OUTPATIENT_CLINIC_OR_DEPARTMENT_OTHER)
Admission: RE | Admit: 2015-11-13 | Discharge: 2015-11-13 | Disposition: A | Discharge status: Home / Self Care | Payer: 59 | Source: Ambulatory Visit | Attending: Orthopedic Surgery | Admitting: Orthopedic Surgery

## 2015-11-13 ENCOUNTER — Ambulatory Visit (HOSPITAL_BASED_OUTPATIENT_CLINIC_OR_DEPARTMENT_OTHER): Payer: 59 | Admitting: Anesthesiology

## 2015-11-13 ENCOUNTER — Encounter (HOSPITAL_BASED_OUTPATIENT_CLINIC_OR_DEPARTMENT_OTHER): Payer: Self-pay

## 2015-11-13 DIAGNOSIS — M65342 Trigger finger, left ring finger: Secondary | ICD-10-CM | POA: Insufficient documentation

## 2015-11-13 DIAGNOSIS — Z87891 Personal history of nicotine dependence: Secondary | ICD-10-CM | POA: Diagnosis not present

## 2015-11-13 DIAGNOSIS — I739 Peripheral vascular disease, unspecified: Secondary | ICD-10-CM | POA: Diagnosis not present

## 2015-11-13 DIAGNOSIS — I10 Essential (primary) hypertension: Secondary | ICD-10-CM | POA: Diagnosis not present

## 2015-11-13 HISTORY — DX: Atherosclerotic heart disease of native coronary artery without angina pectoris: I25.10

## 2015-11-13 HISTORY — DX: Bicuspid aortic valve: Q23.81

## 2015-11-13 HISTORY — PX: TRIGGER FINGER RELEASE: SHX641

## 2015-11-13 HISTORY — DX: Dental restoration status: Z98.811

## 2015-11-13 HISTORY — DX: Trigger finger, left ring finger: M65.342

## 2015-11-13 HISTORY — DX: Congenital insufficiency of aortic valve: Q23.1

## 2015-11-13 SURGERY — RELEASE, A1 PULLEY, FOR TRIGGER FINGER
Anesthesia: General | Site: Finger | Laterality: Left

## 2015-11-13 MED ORDER — GLYCOPYRROLATE 0.2 MG/ML IJ SOLN: 0.2000 mg | Freq: Once | INTRAMUSCULAR | Status: DC | PRN

## 2015-11-13 MED ORDER — CEFAZOLIN SODIUM-DEXTROSE 2-4 GM/100ML-% IV SOLN
INTRAVENOUS | Status: AC
  Filled 2015-11-13: qty 100

## 2015-11-13 MED ORDER — MIDAZOLAM HCL 2 MG/2ML IJ SOLN
INTRAMUSCULAR | Status: AC
  Filled 2015-11-13: qty 2

## 2015-11-13 MED ORDER — DEXAMETHASONE SODIUM PHOSPHATE 10 MG/ML IJ SOLN
INTRAMUSCULAR | Status: AC
  Filled 2015-11-13: qty 1

## 2015-11-13 MED ORDER — FENTANYL CITRATE (PF) 100 MCG/2ML IJ SOLN
50.0000 ug | INTRAMUSCULAR | Status: DC | PRN
  Administered 2015-11-13: 100 ug via INTRAVENOUS

## 2015-11-13 MED ORDER — BUPIVACAINE HCL (PF) 0.5 % IJ SOLN
INTRAMUSCULAR | Status: DC | PRN
  Administered 2015-11-13: 10 mL

## 2015-11-13 MED ORDER — FENTANYL CITRATE (PF) 100 MCG/2ML IJ SOLN
INTRAMUSCULAR | Status: AC
  Filled 2015-11-13: qty 2

## 2015-11-13 MED ORDER — HYDROMORPHONE HCL 1 MG/ML IJ SOLN
0.2500 mg | INTRAMUSCULAR | Status: DC | PRN
  Administered 2015-11-13: 0.5 mg via INTRAVENOUS

## 2015-11-13 MED ORDER — CHLORHEXIDINE GLUCONATE 4 % EX LIQD: 60.0000 mL | Freq: Once | CUTANEOUS | Status: DC

## 2015-11-13 MED ORDER — LIDOCAINE HCL (CARDIAC) 20 MG/ML IV SOLN
INTRAVENOUS | Status: DC | PRN
  Administered 2015-11-13: 50 mg via INTRAVENOUS

## 2015-11-13 MED ORDER — HYDROCODONE-ACETAMINOPHEN 5-325 MG PO TABS
1.0000 | ORAL_TABLET | Freq: Once | ORAL | Status: AC
  Administered 2015-11-13: 1 via ORAL

## 2015-11-13 MED ORDER — ONDANSETRON HCL 4 MG/2ML IJ SOLN
INTRAMUSCULAR | Status: DC | PRN
Start: 2015-11-13 — End: 2015-11-13
  Administered 2015-11-13: 4 mg via INTRAVENOUS

## 2015-11-13 MED ORDER — HYDROCODONE-ACETAMINOPHEN 7.5-325 MG PO TABS: 1.0000 | ORAL_TABLET | Freq: Once | ORAL | Status: DC | PRN

## 2015-11-13 MED ORDER — PROMETHAZINE HCL 25 MG/ML IJ SOLN: 6.2500 mg | INTRAMUSCULAR | Status: DC | PRN

## 2015-11-13 MED ORDER — OXYCODONE-ACETAMINOPHEN 5-325 MG PO TABS: 1.0000 | ORAL_TABLET | ORAL | Status: DC | PRN

## 2015-11-13 MED ORDER — LIDOCAINE HCL (CARDIAC) 20 MG/ML IV SOLN
INTRAVENOUS | Status: AC
  Filled 2015-11-13: qty 5

## 2015-11-13 MED ORDER — LACTATED RINGERS IV SOLN: INTRAVENOUS | Status: DC

## 2015-11-13 MED ORDER — ONDANSETRON HCL 4 MG/2ML IJ SOLN
INTRAMUSCULAR | Status: AC
  Filled 2015-11-13: qty 2

## 2015-11-13 MED ORDER — ONDANSETRON HCL 4 MG PO TABS: 4.0000 mg | ORAL_TABLET | Freq: Three times a day (TID) | ORAL | Status: DC | PRN

## 2015-11-13 MED ORDER — SCOPOLAMINE 1 MG/3DAYS TD PT72: 1.0000 | MEDICATED_PATCH | Freq: Once | TRANSDERMAL | Status: DC | PRN

## 2015-11-13 MED ORDER — MIDAZOLAM HCL 2 MG/2ML IJ SOLN
1.0000 mg | INTRAMUSCULAR | Status: DC | PRN
  Administered 2015-11-13: 2 mg via INTRAVENOUS

## 2015-11-13 MED ORDER — CEFAZOLIN SODIUM-DEXTROSE 2-4 GM/100ML-% IV SOLN
2.0000 g | INTRAVENOUS | Status: AC
  Administered 2015-11-13: 2 g via INTRAVENOUS

## 2015-11-13 MED ORDER — HYDROMORPHONE HCL 1 MG/ML IJ SOLN
INTRAMUSCULAR | Status: AC
  Filled 2015-11-13: qty 1

## 2015-11-13 MED ORDER — PROPOFOL 10 MG/ML IV BOLUS
INTRAVENOUS | Status: DC | PRN
  Administered 2015-11-13: 175 mg via INTRAVENOUS

## 2015-11-13 MED ORDER — LACTATED RINGERS IV SOLN
INTRAVENOUS | Status: DC
  Administered 2015-11-13: 08:00:00 via INTRAVENOUS

## 2015-11-13 MED ORDER — DEXAMETHASONE SODIUM PHOSPHATE 4 MG/ML IJ SOLN
INTRAMUSCULAR | Status: DC | PRN
  Administered 2015-11-13: 10 mg via INTRAVENOUS

## 2015-11-13 MED ORDER — HYDROCODONE-ACETAMINOPHEN 5-325 MG PO TABS
ORAL_TABLET | ORAL | Status: AC
  Filled 2015-11-13: qty 1

## 2015-11-13 SURGICAL SUPPLY — 47 items
BANDAGE ACE 3X5.8 VEL STRL LF (GAUZE/BANDAGES/DRESSINGS) ×2 IMPLANT
BLADE SURG 15 STRL LF DISP TIS (BLADE) ×1 IMPLANT
BLADE SURG 15 STRL SS (BLADE) ×2
BNDG CMPR 9X4 STRL LF SNTH (GAUZE/BANDAGES/DRESSINGS) ×1
BNDG COHESIVE 3X5 TAN STRL LF (GAUZE/BANDAGES/DRESSINGS) ×2 IMPLANT
BNDG ESMARK 4X9 LF (GAUZE/BANDAGES/DRESSINGS) ×1 IMPLANT
CORDS BIPOLAR (ELECTRODE) IMPLANT
COVER BACK TABLE 60X90IN (DRAPES) ×2 IMPLANT
COVER MAYO STAND STRL (DRAPES) ×2 IMPLANT
CUFF TOURNIQUET SINGLE 18IN (TOURNIQUET CUFF) ×1 IMPLANT
DRAPE EXTREMITY T 121X128X90 (DRAPE) ×2 IMPLANT
DRAPE SURG 17X23 STRL (DRAPES) ×2 IMPLANT
DRSG PAD ABDOMINAL 8X10 ST (GAUZE/BANDAGES/DRESSINGS) ×2 IMPLANT
DURAPREP 26ML APPLICATOR (WOUND CARE) ×2 IMPLANT
GAUZE SPONGE 4X4 12PLY STRL (GAUZE/BANDAGES/DRESSINGS) ×2 IMPLANT
GAUZE XEROFORM 1X8 LF (GAUZE/BANDAGES/DRESSINGS) ×2 IMPLANT
GLOVE BIOGEL M 7.0 STRL (GLOVE) ×2 IMPLANT
GLOVE BIOGEL PI IND STRL 7.0 (GLOVE) ×1 IMPLANT
GLOVE BIOGEL PI IND STRL 7.5 (GLOVE) IMPLANT
GLOVE BIOGEL PI INDICATOR 7.0 (GLOVE) ×2
GLOVE BIOGEL PI INDICATOR 7.5 (GLOVE) ×1
GLOVE ECLIPSE 6.5 STRL STRAW (GLOVE) ×1 IMPLANT
GLOVE ECLIPSE 7.0 STRL STRAW (GLOVE) ×2 IMPLANT
GLOVE SURG ORTHO 8.0 STRL STRW (GLOVE) ×2 IMPLANT
GOWN STRL REUS W/ TWL LRG LVL3 (GOWN DISPOSABLE) ×2 IMPLANT
GOWN STRL REUS W/ TWL XL LVL3 (GOWN DISPOSABLE) ×1 IMPLANT
GOWN STRL REUS W/TWL LRG LVL3 (GOWN DISPOSABLE) ×6
GOWN STRL REUS W/TWL XL LVL3 (GOWN DISPOSABLE) ×2
NDL HYPO 25X1 1.5 SAFETY (NEEDLE) ×1 IMPLANT
NEEDLE HYPO 25X1 1.5 SAFETY (NEEDLE) ×2 IMPLANT
NS IRRIG 1000ML POUR BTL (IV SOLUTION) ×2 IMPLANT
PACK BASIN DAY SURGERY FS (CUSTOM PROCEDURE TRAY) ×2 IMPLANT
PAD CAST 3X4 CTTN HI CHSV (CAST SUPPLIES) ×2 IMPLANT
PADDING CAST ABS 3INX4YD NS (CAST SUPPLIES)
PADDING CAST ABS 4INX4YD NS (CAST SUPPLIES)
PADDING CAST ABS COTTON 3X4 (CAST SUPPLIES) ×1 IMPLANT
PADDING CAST ABS COTTON 4X4 ST (CAST SUPPLIES) ×1 IMPLANT
PADDING CAST COTTON 3X4 STRL (CAST SUPPLIES) ×2
SPLINT FIBERGLASS 3X35 (CAST SUPPLIES) ×1 IMPLANT
SPLINT PLASTER CAST XFAST 3X15 (CAST SUPPLIES) IMPLANT
SPLINT PLASTER XTRA FASTSET 3X (CAST SUPPLIES)
STOCKINETTE 4X48 STRL (DRAPES) ×2 IMPLANT
SUT ETHILON 3 0 PS 1 (SUTURE) ×2 IMPLANT
SYR BULB 3OZ (MISCELLANEOUS) ×2 IMPLANT
SYR CONTROL 10ML LL (SYRINGE) ×2 IMPLANT
TOWEL OR 17X24 6PK STRL BLUE (TOWEL DISPOSABLE) ×2 IMPLANT
UNDERPAD 30X30 (UNDERPADS AND DIAPERS) ×1 IMPLANT

## 2015-11-13 NOTE — Interval H&P Note (Signed)
History and Physical Interval Note:  11/13/2015 7:36 AM  Erin [name]  has presented today for surgery, with the diagnosis of trigger finger, left ring finger  M65.342  The various methods of treatment have been discussed with the patient and family. After consideration of risks, benefits and other options for treatment, the patient has consented to  Procedure(s): LEFT RING FINGER RELEASE TRIGGER FINGER/A-1 PULLEY (Left) as a surgical intervention .  The patient's history has been reviewed, patient examined, no change in status, stable for surgery.  I have reviewed the patient's chart and labs.  Questions were answered to the patient's satisfaction.     Ninetta Lights

## 2015-11-13 NOTE — Anesthesia Postprocedure Evaluation (Signed)
Anesthesia Post Note  Patient: Erin [name]  Procedure(s) Performed: Procedure(s) (LRB): LEFT RING FINGER RELEASE TRIGGER FINGER/A-1 PULLEY (Left)  Patient location during evaluation: PACU Anesthesia Type: General Level of consciousness: awake Pain management: pain level controlled Vital Signs Assessment: post-procedure vital signs reviewed and stable Respiratory status: spontaneous breathing Cardiovascular status: stable Postop Assessment: no signs of nausea or vomiting Anesthetic complications: no    Last Vitals:  Filed Vitals:   11/13/15 1015 11/13/15 1045  BP: 135/77 147/75  Pulse: 65 68  Temp:  36.4 C  Resp: 18 16    Last Pain:  Filed Vitals:   11/13/15 1050  PainSc: [place]

## 2015-11-13 NOTE — Discharge Instructions (Signed)
Do not remove bandage until follow up appointment.  May ice for up to 20 minutes at a time for pain and swelling.  Follow up appointment in one week.   SEEK MEDICAL CARE IF: You have swelling of your calf or leg.  You develop shortness of breath or chest pain.  You have redness, swelling, or increasing pain in the wound.  There is pus or any unusual drainage coming from the surgical site.  You notice a bad smell coming from the surgical site or dressing.  The surgical site breaks open after sutures or staples have been removed.  There is persistent bleeding from the suture or staple line.  You are getting worse or are not improving.  You have any other questions or concerns.  SEEK IMMEDIATE MEDICAL CARE IF:  You have a fever.  You develop a rash.  You have difficulty breathing.  You develop any reaction or side effects to medicines given.  Your knee motion is decreasing rather than improving.  MAKE SURE YOU:  Understand these instructions.  Will watch your condition.  Will get help right away if you are not doing well or get worse.    Post Anesthesia Home Care Instructions  Activity: Get plenty of rest for the remainder of the day. A responsible adult should stay with you for 24 hours following the procedure.  For the next 24 hours, DO NOT: -Drive a car -Paediatric nurse -Drink alcoholic beverages -Take any medication unless instructed by your physician -Make any legal decisions or sign important papers.  Meals: Start with liquid foods such as gelatin or soup. Progress to regular foods as tolerated. Avoid greasy, spicy, heavy foods. If nausea and/or vomiting occur, drink only clear liquids until the nausea and/or vomiting subsides. Call your physician if vomiting continues.  Special Instructions/Symptoms: Your throat may feel dry or sore from the anesthesia or the breathing tube placed in your throat during surgery. If this causes discomfort, gargle with warm salt water. The  discomfort should disappear within 24 hours.  If you had a scopolamine patch placed behind your ear for the management of post- operative nausea and/or vomiting:  1. The medication in the patch is effective for 72 hours, after which it should be removed.  Wrap patch in a tissue and discard in the trash. Wash hands thoroughly with soap and water. 2. You may remove the patch earlier than 72 hours if you experience unpleasant side effects which may include dry mouth, dizziness or visual disturbances. 3. Avoid touching the patch. Wash your hands with soap and water after contact with the patch.

## 2015-11-13 NOTE — Op Note (Deleted)
[name], [name]                  ACCOUNT NO.:  [id number]  MEDICAL RECORD NO.:  [id number]  LOCATION:                               FACILITY:  [facility]  PHYSICIAN:  Ninetta Lights, M.D. DATE OF BIRTH:  January 26, 1956  DATE OF PROCEDURE: DATE OF DISCHARGE:                              OPERATIVE REPORT   PREOPERATIVE DIAGNOSIS:  Symptomatic triggering A1 pulley, left ring finger.  POSTOPERATIVE DIAGNOSIS:  Symptomatic triggering A1 pulley, left ring finger.  PROCEDURE:  Release A1 pulley, left ring finger.  SURGEON:  Ninetta Lights, MD  ASSISTANT:  Elmyra Ricks, PA  ANESTHESIA:  General.  BLOOD LOSS:  Minimal.  SPECIMENS:  None.  CULTURES:  None.  COMPLICATIONS:  None.  DRESSINGS:  Soft compressive.  TOURNIQUET TIME:  30 minutes.  DESCRIPTION OF PROCEDURE:  The patient was brought to operating room, placed on the operating table in supine position.  After adequate anesthesia had been obtained, tourniquet applied, prepped and draped in usual sterile fashion.  Exsanguinated with elevation of Esmarch. Tourniquet inflated to 250 mmHg.  A transverse incision over the A1 pulley, left ring finger.  Skin and subcutaneous tissue divided. Neurovascular bundles identified and retracted.  A1 pulley released in its entirety under direct visualization.  Markedly thickened.  Confirmed obliteration of triggering.  Wound irrigated, closed with nylon.  Margins were injected with Marcaine.  Sterile compressive dressing applied.  Anesthesia reversed.  Brought to recovery room.  Tolerated surgery well with no complications.     Ninetta Lights, M.D.   ______________________________ Ninetta Lights, M.D.    DFM/MEDQ  D:  11/13/2015  T:  11/13/2015  Job:  [id number]

## 2015-11-13 NOTE — Anesthesia Procedure Notes (Signed)
Procedure Name: LMA Insertion Performed by: Terrance Mass Pre-anesthesia Checklist: Patient identified, Emergency Drugs available, Suction available and Patient being monitored Patient Re-evaluated:Patient Re-evaluated prior to inductionOxygen Delivery Method: Circle System Utilized Preoxygenation: Pre-oxygenation with 100% oxygen Intubation Type: IV induction Ventilation: Mask ventilation without difficulty LMA: LMA inserted LMA Size: 4.0 Number of attempts: 1 Placement Confirmation: positive ETCO2 Tube secured with: Tape Dental Injury: Teeth and Oropharynx as per pre-operative assessment

## 2015-11-13 NOTE — Op Note (Signed)
NAMEDELOREAN, OLWELL                  ACCOUNT NO.:  [id number]  MEDICAL RECORD NO.:  [id number]  LOCATION:                               FACILITY:  [facility]  PHYSICIAN:  Ninetta Lights, M.D. DATE OF BIRTH:  01-10-1956  DATE OF PROCEDURE: DATE OF DISCHARGE:                              OPERATIVE REPORT   PREOPERATIVE DIAGNOSIS:  Symptomatic triggering A1 pulley, left ring finger.  POSTOPERATIVE DIAGNOSIS:  Symptomatic triggering A1 pulley, left ring finger.  PROCEDURE:  Release A1 pulley, left ring finger.  SURGEON:  Ninetta Lights, MD  ASSISTANT:  Elmyra Ricks, PA  ANESTHESIA:  General.  BLOOD LOSS:  Minimal.  SPECIMENS:  None.  CULTURES:  None.  COMPLICATIONS:  None.  DRESSINGS:  Soft compressive.  TOURNIQUET TIME:  30 minutes.  DESCRIPTION OF PROCEDURE:  The patient was brought to operating room, placed on the operating table in supine position.  After adequate anesthesia had been obtained, tourniquet applied, prepped and draped in usual sterile fashion.  Exsanguinated with elevation of Esmarch. Tourniquet inflated to 250 mmHg.  A transverse incision over the A1 pulley, left ring finger.  Skin and subcutaneous tissue divided. Neurovascular bundles identified and retracted.  A1 pulley released in its entirety under direct visualization.  Markedly thickened.  Confirmed obliteration of triggering.  Wound irrigated, closed with nylon.  Margins were injected with Marcaine.  Sterile compressive dressing applied.  Anesthesia reversed.  Brought to recovery room.  Tolerated surgery well with no complications.     Ninetta Lights, M.D.   ______________________________ Ninetta Lights, M.D.    DFM/MEDQ  D:  11/13/2015  T:  11/13/2015  Job:  [id number]

## 2015-11-13 NOTE — Anesthesia Preprocedure Evaluation (Signed)
Anesthesia Evaluation  Patient identified by MRN, date of birth, ID band Patient awake    Reviewed: Allergy & Precautions, NPO status , Patient's Chart, lab work & pertinent test results  Airway Mallampati: II  TM Distance: >3 FB Neck ROM: Full    Dental   Pulmonary former smoker,    breath sounds clear to auscultation       Cardiovascular hypertension, Pt. on medications + Peripheral Vascular Disease   Rhythm:Regular Rate:Normal  Bicuspid AV. No stenosis. Normal EF   Neuro/Psych negative neurological ROS     GI/Hepatic negative GI ROS, Neg liver ROS,   Endo/Other  negative endocrine ROS  Renal/GU negative Renal ROS     Musculoskeletal   Abdominal   Peds  Hematology negative hematology ROS (+)   Anesthesia Other Findings   Reproductive/Obstetrics                             Anesthesia Physical Anesthesia Plan  ASA: II  Anesthesia Plan: General   Post-op Pain Management:    Induction: Intravenous  Airway Management Planned: LMA  Additional Equipment:   Intra-op Plan:   Post-operative Plan: Extubation in OR  Informed Consent: I have reviewed the patients History and Physical, chart, labs and discussed the procedure including the risks, benefits and alternatives for the proposed anesthesia with the patient or authorized representative who has indicated his/her understanding and acceptance.   Dental advisory given  Plan Discussed with: CRNA  Anesthesia Plan Comments:         Anesthesia Quick Evaluation

## 2015-11-13 NOTE — Op Note (Deleted)
NAMEASHANA, GOLDFARB                  ACCOUNT NO.:  [id number]  MEDICAL RECORD NO.:  [id number]  LOCATION:                               FACILITY:  [facility]  PHYSICIAN:  Ninetta Lights, M.D. DATE OF BIRTH:  [birth year]  DATE OF PROCEDURE: DATE OF DISCHARGE:                              OPERATIVE REPORT   PREOPERATIVE DIAGNOSIS:  Symptomatic triggering A1 pulley, left ring finger.  POSTOPERATIVE DIAGNOSIS:  Symptomatic triggering A1 pulley, left ring finger.  PROCEDURE:  Release A1 pulley, left ring finger.  SURGEON:  Ninetta Lights, MD  ASSISTANT:  Elmyra Ricks, PA  ANESTHESIA:  General.  BLOOD LOSS:  Minimal.  SPECIMENS:  None.  CULTURES:  None.  COMPLICATIONS:  None.  DRESSINGS:  Soft compressive.  TOURNIQUET TIME:  30 minutes.  DESCRIPTION OF PROCEDURE:  The patient was brought to operating room, placed on the operating table in supine position.  After adequate anesthesia had been obtained, tourniquet applied, prepped and draped in usual sterile fashion.  Exsanguinated with elevation of Esmarch. Tourniquet inflated to 250 mmHg.  A transverse incision over the A1 pulley, left ring finger.  Skin and subcutaneous tissue divided. Neurovascular bundles identified and retracted.  A1 pulley released in its entirety under direct visualization.  Markedly thickened.  Confirmed obliteration of triggering.  Wound irrigated, closed with nylon.  Margins were injected with Marcaine.  Sterile compressive dressing applied.  Anesthesia reversed.  Brought to recovery room.  Tolerated surgery well with no complications.     Ninetta Lights, M.D.   ______________________________ Ninetta Lights, M.D.    DFM/MEDQ  D:  11/13/2015  T:  11/13/2015  Job:  [id number]

## 2015-11-13 NOTE — Transfer of Care (Signed)
Immediate Anesthesia Transfer of Care Note  Patient: Erin [name]  Procedure(s) Performed: Procedure(s): LEFT RING FINGER RELEASE TRIGGER FINGER/A-1 PULLEY (Left)  Patient Location: PACU  Anesthesia Type:General  Level of Consciousness: awake  Airway & Oxygen Therapy: Patient Spontanous Breathing and Patient connected to face mask oxygen  Post-op Assessment: Report given to RN and Post -op Vital signs reviewed and stable  Post vital signs: Reviewed and stable  Last Vitals:  Filed Vitals:   11/13/15 0729 11/13/15 0937  BP: 162/85   Pulse: 58 63  Temp: 36.6 C   Resp: 20     Complications: No apparent anesthesia complications

## 2015-11-14 ENCOUNTER — Encounter (HOSPITAL_BASED_OUTPATIENT_CLINIC_OR_DEPARTMENT_OTHER): Payer: Self-pay | Admitting: Orthopedic Surgery

## 2016-01-06 ENCOUNTER — Encounter: Payer: Self-pay | Admitting: Internal Medicine

## 2016-07-06 ENCOUNTER — Encounter: Payer: Self-pay | Admitting: Cardiovascular Disease

## 2016-07-07 ENCOUNTER — Encounter: Payer: Self-pay | Admitting: Cardiovascular Disease

## 2016-07-13 ENCOUNTER — Other Ambulatory Visit: Payer: Self-pay | Admitting: Cardiovascular Disease

## 2016-07-13 ENCOUNTER — Ambulatory Visit (INDEPENDENT_AMBULATORY_CARE_PROVIDER_SITE_OTHER): Payer: 59 | Admitting: Cardiovascular Disease

## 2016-07-13 ENCOUNTER — Encounter: Payer: Self-pay | Admitting: Cardiovascular Disease

## 2016-07-13 ENCOUNTER — Encounter (INDEPENDENT_AMBULATORY_CARE_PROVIDER_SITE_OTHER): Payer: Self-pay

## 2016-07-13 VITALS — BP 142/80 | HR 72 | Ht 62.5 in | Wt 125.1 lb

## 2016-07-13 DIAGNOSIS — I10 Essential (primary) hypertension: Secondary | ICD-10-CM

## 2016-07-13 DIAGNOSIS — R0989 Other specified symptoms and signs involving the circulatory and respiratory systems: Secondary | ICD-10-CM

## 2016-07-13 DIAGNOSIS — I350 Nonrheumatic aortic (valve) stenosis: Secondary | ICD-10-CM

## 2016-07-13 DIAGNOSIS — E782 Mixed hyperlipidemia: Secondary | ICD-10-CM | POA: Diagnosis not present

## 2016-07-13 LAB — COMPREHENSIVE METABOLIC PANEL
ALK PHOS: 96 U/L (ref 33–130)
ALT: 21 U/L (ref 6–29)
AST: 27 U/L (ref 10–35)
Albumin: 4.2 g/dL (ref 3.6–5.1)
BILIRUBIN TOTAL: 0.2 mg/dL (ref 0.2–1.2)
BUN: 14 mg/dL (ref 7–25)
CO2: 27 mmol/L (ref 20–31)
CREATININE: 0.74 mg/dL (ref 0.50–1.05)
Calcium: 9.4 mg/dL (ref 8.6–10.4)
Chloride: 105 mmol/L (ref 98–110)
GLUCOSE: 83 mg/dL (ref 65–99)
Potassium: 4.3 mmol/L (ref 3.5–5.3)
Sodium: 139 mmol/L (ref 135–146)
Total Protein: 6.6 g/dL (ref 6.1–8.1)

## 2016-07-13 LAB — CHOLESTEROL, TOTAL: Cholesterol: 148 mg/dL (ref <200)

## 2016-07-13 NOTE — Progress Notes (Signed)
Erin [name] Date of Birth  [birth year] [facility] [address]    [place], [place]  [address] [phone]  Fax  [phone]  Problem list 1. Hyperlipidemia 2. Functional bicuspid aortic valve  Erin [name] is a 23 with a hx of hyperlipidemia and a functional bicuspid aortic valve.  She has done well. Denies any chest pain or dyspnea.    Nov. 21, 2014:  Elantra is ding ok.  She is taking Crestor and not the mevacor as listed in med list.  No CP,  Anxious about her BP;.  She avoids salt .  BP is typicall 140s / 80's.  Nov. 24, 2015:  BP is a bit high.   Not eating any extra salt.   Has been taking some cold meds recently.  Has been exercising twice a week.   Boot camp.  No CP or dyspnea.   Nov. 30,  2016: Doing great. Boot camp twice a week .  Gets her labs at Dr. Silvestre Mesi office.   Nov. 28, 2017:  Doing well Still exercising .  No CP , no dyspnea.     Current Outpatient Prescriptions  Medication Sig Dispense Refill  . aspirin 81 MG tablet Take 81 mg by mouth. Once a week    . benazepril (LOTENSIN) 40 MG tablet Take 40 mg by mouth daily.    Marland Kitchen ezetimibe (ZETIA) 10 MG tablet Take 10 mg by mouth daily.    . fluticasone (FLONASE) 50 MCG/ACT nasal spray Place 1 spray into both nostrils daily.    . rosuvastatin (CRESTOR) 20 MG tablet Take 20 mg by mouth daily.    . valACYclovir (VALTREX) 1000 MG tablet Take 1 g by mouth daily as needed. AS NEEDED FOR COLD SORES     No current facility-administered medications for this visit.     Allergies  Allergen Reactions  . Lipitor [Atorvastatin Calcium] Other (See Comments)    MUSCLE ACHES    Past Medical History:  Diagnosis Date  . Coronary artery disease    mild, per cardiology note  . Dental crowns present   . Hyperlipidemia   . Hypertension    states under control with med., has been on med. x 12 yr.  . Tricuspid but functionally bicuspid aortic valve    congenital, per pt. - is checked yearly by cardiologist   . Trigger ring finger of left hand 10/2015    Past Surgical History:  Procedure Laterality Date  . ABDOMINAL HYSTERECTOMY     complete  . APPENDECTOMY    . CHOLECYSTECTOMY    . FOOT GANGLION EXCISION Left 04/09/2004  . INCISIONAL HERNIA REPAIR Right 04/28/2004   RUQ ventral  . MINOR IRRIGATION AND DEBRIDEMENT OF WOUND Left 07/13/2006   elbow  . OLECRANON BURSA EXCISION Left 02/02/2006  . ROTATOR CUFF REPAIR Bilateral   . TRIGGER FINGER RELEASE Left 11/13/2015   Procedure: LEFT RING FINGER RELEASE TRIGGER FINGER/A-1 PULLEY;  Surgeon: Ninetta Lights, MD;  Location: [facility];  Service: Orthopedics;  Laterality: Left;    History  Smoking Status  . Former Smoker  . Quit date: 06/11/2010  Smokeless Tobacco  . Never Used    History  Alcohol Use No    No family history on file.  Reviw of Systems:  Reviewed in the HPI.  All other systems are negative.  Physical Exam: BP (!) 142/80 (BP Location: Left Arm, Patient Position: Sitting, Cuff Size: Normal)   Pulse 72   Ht 5' 2.5" (  1.588 m)   Wt 125 lb 1.9 oz (56.8 kg)   BMI 22.52 kg/m  The patient is alert and oriented x 3.  The mood and affect are normal.   Skin: warm and dry.  Color is normal.    HEENT:   Normocephalic/atraumatic. The mucous membranes are moist. Her carotids are 2+ loud right carotid bruit, soft left carotid bruit  Lungs: Her lungs are clear.  Heart: Regular rate, S1-S2.   Soft systolic murmur  Abdomen: + BS, non tender, no HSM, she has a soft  bruit, just right of midline in mid abdomen  Extremities:  No c/c/e Neuro:  Non focal, gait is normal.    ECG: Nov. 28, 2017:  NSR at 74.   Possible LAE   Assessment / Plan:   1 . Carotid artery disease:  has a moderate bilateral carotid artery disease by Duplex scan.   Will repeat carotid duplex scan next year  .  2. Hyperlipidemia.   Continue Nutritional therapist .   Labs from Dr. Joylene Draft look ok.   Her LDL is 100. She has mild carotid artery disease so her  goal LDL should be around 70. Encouraged her to eats less fat in her diet   3. Abdominal bruit. She has a soft abdominal bruit. It's located just to the right of midline. We'll get a duplex scan of her iliac arteries and renal arteries.  Will check fasting labs today   Will see her in 1 year.     Mertie Moores, MD  07/13/2016 10:05 AM    [address],  [place], [place]  [address] Pager [phone] Phone: [phone]; Fax: [phone]

## 2016-07-13 NOTE — Patient Instructions (Signed)
Medication Instructions:  Your physician recommends that you continue on your current medications as directed. Please refer to the Current Medication list given to you today.   Labwork: TODAY - cholesterol, complete metabolic panel   Testing/Procedures: Your physician has requested that you have a renal artery duplex. During this test, an ultrasound is used to evaluate blood flow to the kidneys. Allow one hour for this exam. Do not eat after midnight the day before and avoid carbonated beverages. Take your medications as you usually do.  Your physician has requested that you have an abdominal aorta duplex. During this test, an ultrasound is used to evaluate the aorta. Allow 30 minutes for this exam. Do not eat after midnight the day before and avoid carbonated beverages   Follow-Up: Your physician wants you to follow-up in: 1 year with Dr. Acie Fredrickson.  You will receive a reminder letter in the mail two months in advance. If you don't receive a letter, please call our office to schedule the follow-up appointment.   If you need a refill on your cardiac medications before your next appointment, please call your pharmacy.   Thank you for choosing [facility]! Christen Bame, RN [phone]

## 2016-07-14 LAB — LIPID PANEL
CHOLESTEROL: 147 mg/dL (ref <200)
HDL: 51 mg/dL (ref >50)
LDL Cholesterol: 68 mg/dL (ref <100)
Total CHOL/HDL Ratio: 2.9 Ratio (ref <5.0)
Triglycerides: 138 mg/dL (ref <150)
VLDL: 28 mg/dL (ref <30)

## 2016-07-19 ENCOUNTER — Ambulatory Visit: Payer: 59 | Admitting: Cardiovascular Disease

## 2016-07-20 ENCOUNTER — Other Ambulatory Visit: Payer: Self-pay | Admitting: Cardiovascular Disease

## 2016-07-20 DIAGNOSIS — I10 Essential (primary) hypertension: Secondary | ICD-10-CM

## 2016-07-20 DIAGNOSIS — R0989 Other specified symptoms and signs involving the circulatory and respiratory systems: Secondary | ICD-10-CM

## 2016-08-10 ENCOUNTER — Ambulatory Visit (HOSPITAL_COMMUNITY)
Admission: RE | Admit: 2016-08-10 | Discharge: 2016-08-10 | Disposition: A | Discharge status: Home / Self Care | Payer: 59 | Source: Ambulatory Visit | Attending: Cardiology | Admitting: Cardiology

## 2016-08-10 ENCOUNTER — Encounter (HOSPITAL_COMMUNITY): Payer: 59

## 2016-08-10 DIAGNOSIS — I700 Atherosclerosis of aorta: Secondary | ICD-10-CM | POA: Diagnosis not present

## 2016-08-10 DIAGNOSIS — R0989 Other specified symptoms and signs involving the circulatory and respiratory systems: Secondary | ICD-10-CM | POA: Diagnosis not present

## 2016-08-10 DIAGNOSIS — I10 Essential (primary) hypertension: Secondary | ICD-10-CM

## 2016-08-10 DIAGNOSIS — I739 Peripheral vascular disease, unspecified: Secondary | ICD-10-CM | POA: Diagnosis not present

## 2016-09-10 DIAGNOSIS — M79641 Pain in right hand: Secondary | ICD-10-CM | POA: Diagnosis not present

## 2016-09-13 ENCOUNTER — Encounter (HOSPITAL_BASED_OUTPATIENT_CLINIC_OR_DEPARTMENT_OTHER): Payer: Self-pay | Admitting: *Deleted

## 2016-09-16 ENCOUNTER — Encounter (HOSPITAL_BASED_OUTPATIENT_CLINIC_OR_DEPARTMENT_OTHER)
Admission: RE | Disposition: A | Discharge status: Home / Self Care | Payer: Self-pay | Source: Ambulatory Visit | Attending: Orthopedic Surgery

## 2016-09-16 ENCOUNTER — Ambulatory Visit (HOSPITAL_BASED_OUTPATIENT_CLINIC_OR_DEPARTMENT_OTHER): Payer: 59 | Admitting: Anesthesiology

## 2016-09-16 ENCOUNTER — Encounter (HOSPITAL_BASED_OUTPATIENT_CLINIC_OR_DEPARTMENT_OTHER): Payer: Self-pay | Admitting: Anesthesiology

## 2016-09-16 ENCOUNTER — Ambulatory Visit (HOSPITAL_BASED_OUTPATIENT_CLINIC_OR_DEPARTMENT_OTHER)
Admission: RE | Admit: 2016-09-16 | Discharge: 2016-09-16 | Disposition: A | Discharge status: Home / Self Care | Payer: 59 | Source: Ambulatory Visit | Attending: Orthopedic Surgery | Admitting: Orthopedic Surgery

## 2016-09-16 DIAGNOSIS — Z87891 Personal history of nicotine dependence: Secondary | ICD-10-CM | POA: Insufficient documentation

## 2016-09-16 DIAGNOSIS — M65341 Trigger finger, right ring finger: Secondary | ICD-10-CM | POA: Diagnosis not present

## 2016-09-16 DIAGNOSIS — I2510 Atherosclerotic heart disease of native coronary artery without angina pectoris: Secondary | ICD-10-CM | POA: Insufficient documentation

## 2016-09-16 DIAGNOSIS — M65351 Trigger finger, right little finger: Secondary | ICD-10-CM | POA: Diagnosis not present

## 2016-09-16 DIAGNOSIS — I10 Essential (primary) hypertension: Secondary | ICD-10-CM | POA: Diagnosis not present

## 2016-09-16 DIAGNOSIS — M65331 Trigger finger, right middle finger: Secondary | ICD-10-CM | POA: Diagnosis not present

## 2016-09-16 DIAGNOSIS — I350 Nonrheumatic aortic (valve) stenosis: Secondary | ICD-10-CM | POA: Diagnosis not present

## 2016-09-16 HISTORY — PX: TRIGGER FINGER RELEASE: SHX641

## 2016-09-16 SURGERY — RELEASE, A1 PULLEY, FOR TRIGGER FINGER
Anesthesia: Monitor Anesthesia Care | Site: Hand | Laterality: Right

## 2016-09-16 MED ORDER — ONDANSETRON HCL 4 MG/2ML IJ SOLN
INTRAMUSCULAR | Status: DC | PRN
  Administered 2016-09-16: 4 mg via INTRAVENOUS

## 2016-09-16 MED ORDER — METOCLOPRAMIDE HCL 5 MG PO TABS: 5.0000 mg | ORAL_TABLET | Freq: Three times a day (TID) | ORAL | Status: DC | PRN

## 2016-09-16 MED ORDER — CHLORHEXIDINE GLUCONATE 4 % EX LIQD: 60.0000 mL | Freq: Once | CUTANEOUS | Status: DC

## 2016-09-16 MED ORDER — OXYCODONE-ACETAMINOPHEN 5-325 MG PO TABS: 1.0000 | ORAL_TABLET | ORAL | 0 refills | Status: DC | PRN

## 2016-09-16 MED ORDER — FENTANYL CITRATE (PF) 100 MCG/2ML IJ SOLN
50.0000 ug | INTRAMUSCULAR | Status: DC | PRN
  Administered 2016-09-16: 100 ug via INTRAVENOUS

## 2016-09-16 MED ORDER — PROPOFOL 10 MG/ML IV BOLUS
INTRAVENOUS | Status: DC | PRN
  Administered 2016-09-16: 150 mg via INTRAVENOUS

## 2016-09-16 MED ORDER — MIDAZOLAM HCL 2 MG/2ML IJ SOLN
INTRAMUSCULAR | Status: AC
  Filled 2016-09-16: qty 2

## 2016-09-16 MED ORDER — ONDANSETRON HCL 4 MG PO TABS: 4.0000 mg | ORAL_TABLET | Freq: Four times a day (QID) | ORAL | Status: DC | PRN

## 2016-09-16 MED ORDER — ONDANSETRON HCL 4 MG/2ML IJ SOLN: 4.0000 mg | Freq: Four times a day (QID) | INTRAMUSCULAR | Status: DC | PRN

## 2016-09-16 MED ORDER — HYDROMORPHONE HCL 1 MG/ML IJ SOLN: 0.5000 mg | INTRAMUSCULAR | Status: DC | PRN

## 2016-09-16 MED ORDER — LIDOCAINE 2% (20 MG/ML) 5 ML SYRINGE
INTRAMUSCULAR | Status: DC | PRN
  Administered 2016-09-16: 100 mg via INTRAVENOUS

## 2016-09-16 MED ORDER — OXYCODONE-ACETAMINOPHEN 5-325 MG PO TABS: 1.0000 | ORAL_TABLET | ORAL | Status: DC | PRN

## 2016-09-16 MED ORDER — ONDANSETRON HCL 4 MG PO TABS: 4.0000 mg | ORAL_TABLET | Freq: Three times a day (TID) | ORAL | 0 refills | Status: DC | PRN

## 2016-09-16 MED ORDER — SCOPOLAMINE 1 MG/3DAYS TD PT72: 1.0000 | MEDICATED_PATCH | Freq: Once | TRANSDERMAL | Status: DC | PRN

## 2016-09-16 MED ORDER — LACTATED RINGERS IV SOLN: INTRAVENOUS | Status: DC

## 2016-09-16 MED ORDER — PROPOFOL 10 MG/ML IV BOLUS
INTRAVENOUS | Status: AC
  Filled 2016-09-16: qty 20

## 2016-09-16 MED ORDER — FENTANYL CITRATE (PF) 100 MCG/2ML IJ SOLN: 25.0000 ug | INTRAMUSCULAR | Status: DC | PRN

## 2016-09-16 MED ORDER — DEXAMETHASONE SODIUM PHOSPHATE 10 MG/ML IJ SOLN
INTRAMUSCULAR | Status: DC | PRN
  Administered 2016-09-16: 10 mg via INTRAVENOUS

## 2016-09-16 MED ORDER — CEFAZOLIN SODIUM-DEXTROSE 2-4 GM/100ML-% IV SOLN
INTRAVENOUS | Status: AC
  Filled 2016-09-16: qty 100

## 2016-09-16 MED ORDER — BUPIVACAINE HCL (PF) 0.5 % IJ SOLN
INTRAMUSCULAR | Status: DC | PRN
  Administered 2016-09-16: 10 mL

## 2016-09-16 MED ORDER — MIDAZOLAM HCL 2 MG/2ML IJ SOLN
1.0000 mg | INTRAMUSCULAR | Status: DC | PRN
  Administered 2016-09-16: 2 mg via INTRAVENOUS

## 2016-09-16 MED ORDER — ONDANSETRON HCL 4 MG/2ML IJ SOLN
INTRAMUSCULAR | Status: AC
  Filled 2016-09-16: qty 2

## 2016-09-16 MED ORDER — FENTANYL CITRATE (PF) 100 MCG/2ML IJ SOLN
INTRAMUSCULAR | Status: AC
  Filled 2016-09-16: qty 2

## 2016-09-16 MED ORDER — LACTATED RINGERS IV SOLN
INTRAVENOUS | Status: DC
  Administered 2016-09-16: 10 mL/h via INTRAVENOUS

## 2016-09-16 MED ORDER — DEXAMETHASONE SODIUM PHOSPHATE 10 MG/ML IJ SOLN
INTRAMUSCULAR | Status: AC
  Filled 2016-09-16: qty 1

## 2016-09-16 MED ORDER — METOCLOPRAMIDE HCL 5 MG/ML IJ SOLN: 5.0000 mg | Freq: Three times a day (TID) | INTRAMUSCULAR | Status: DC | PRN

## 2016-09-16 MED ORDER — CEFAZOLIN SODIUM-DEXTROSE 2-4 GM/100ML-% IV SOLN
2.0000 g | INTRAVENOUS | Status: AC
  Administered 2016-09-16: 2 g via INTRAVENOUS

## 2016-09-16 MED ORDER — LIDOCAINE 2% (20 MG/ML) 5 ML SYRINGE
INTRAMUSCULAR | Status: AC
  Filled 2016-09-16: qty 5

## 2016-09-16 SURGICAL SUPPLY — 43 items
BANDAGE ACE 3X5.8 VEL STRL LF (GAUZE/BANDAGES/DRESSINGS) ×2 IMPLANT
BLADE SURG 15 STRL LF DISP TIS (BLADE) ×1 IMPLANT
BLADE SURG 15 STRL SS (BLADE) ×2
BNDG CMPR 9X4 STRL LF SNTH (GAUZE/BANDAGES/DRESSINGS)
BNDG COHESIVE 3X5 TAN STRL LF (GAUZE/BANDAGES/DRESSINGS) ×2 IMPLANT
BNDG ESMARK 4X9 LF (GAUZE/BANDAGES/DRESSINGS) IMPLANT
CORDS BIPOLAR (ELECTRODE) IMPLANT
COVER BACK TABLE 60X90IN (DRAPES) ×2 IMPLANT
COVER MAYO STAND STRL (DRAPES) ×2 IMPLANT
CUFF TOURNIQUET SINGLE 18IN (TOURNIQUET CUFF) IMPLANT
DRAPE EXTREMITY T 121X128X90 (DRAPE) ×2 IMPLANT
DRAPE SURG 17X23 STRL (DRAPES) ×2 IMPLANT
DRSG PAD ABDOMINAL 8X10 ST (GAUZE/BANDAGES/DRESSINGS) ×2 IMPLANT
DURAPREP 26ML APPLICATOR (WOUND CARE) ×2 IMPLANT
GAUZE SPONGE 4X4 12PLY STRL (GAUZE/BANDAGES/DRESSINGS) ×2 IMPLANT
GAUZE XEROFORM 1X8 LF (GAUZE/BANDAGES/DRESSINGS) ×2 IMPLANT
GLOVE BIOGEL PI IND STRL 7.0 (GLOVE) ×1 IMPLANT
GLOVE BIOGEL PI INDICATOR 7.0 (GLOVE) ×1
GLOVE ECLIPSE 7.0 STRL STRAW (GLOVE) ×2 IMPLANT
GLOVE SURG ORTHO 8.0 STRL STRW (GLOVE) ×2 IMPLANT
GOWN STRL REUS W/ TWL LRG LVL3 (GOWN DISPOSABLE) ×1 IMPLANT
GOWN STRL REUS W/ TWL XL LVL3 (GOWN DISPOSABLE) ×2 IMPLANT
GOWN STRL REUS W/TWL LRG LVL3 (GOWN DISPOSABLE) ×2
GOWN STRL REUS W/TWL XL LVL3 (GOWN DISPOSABLE) ×4
NDL HYPO 25X1 1.5 SAFETY (NEEDLE) ×1 IMPLANT
NEEDLE HYPO 25X1 1.5 SAFETY (NEEDLE) ×2 IMPLANT
NS IRRIG 1000ML POUR BTL (IV SOLUTION) ×2 IMPLANT
PACK BASIN DAY SURGERY FS (CUSTOM PROCEDURE TRAY) ×2 IMPLANT
PAD CAST 3X4 CTTN HI CHSV (CAST SUPPLIES) ×2 IMPLANT
PADDING CAST ABS 3INX4YD NS (CAST SUPPLIES) ×1
PADDING CAST ABS 4INX4YD NS (CAST SUPPLIES) ×1
PADDING CAST ABS COTTON 3X4 (CAST SUPPLIES) ×1 IMPLANT
PADDING CAST ABS COTTON 4X4 ST (CAST SUPPLIES) ×1 IMPLANT
PADDING CAST COTTON 3X4 STRL (CAST SUPPLIES) ×4
SPLINT FIBERGLASS 3X35 (CAST SUPPLIES) ×2 IMPLANT
SPLINT PLASTER CAST XFAST 3X15 (CAST SUPPLIES) IMPLANT
SPLINT PLASTER XTRA FASTSET 3X (CAST SUPPLIES)
STOCKINETTE 4X48 STRL (DRAPES) ×2 IMPLANT
SUT ETHILON 3 0 PS 1 (SUTURE) ×2 IMPLANT
SYR BULB 3OZ (MISCELLANEOUS) ×2 IMPLANT
SYR CONTROL 10ML LL (SYRINGE) ×2 IMPLANT
TOWEL OR 17X24 6PK STRL BLUE (TOWEL DISPOSABLE) ×2 IMPLANT
UNDERPAD 30X30 (UNDERPADS AND DIAPERS) ×2 IMPLANT

## 2016-09-16 NOTE — H&P (Signed)
Erin [name] presents today with right hand long and small finger triggering.  This has been ongoing for the past 3 months.  Pain is worse when going from flexion to extension.    Past medical, family and social history reviewed in detail on patient questionnaire and signed.  Review of systems as detailed in HPI; all others reviewed and are negative.    Exam:  Marked ttp and palpable nodules over long and small finger mcp joints.  Triggering present to both digits as well.  X-rays reveal mild OA. Otherwise unremarkable  Impression: right hand long and small trigger fingers  Plan: Patient would like to proceed with definitive treatment of A-1 pulley release to right hand long and small fingers.  Risks, benefits and possible complications reviewed.  Rehab and recovery time discussed.

## 2016-09-16 NOTE — Anesthesia Preprocedure Evaluation (Signed)
Anesthesia Evaluation  Patient identified by MRN, date of birth, ID band Patient awake    Reviewed: Allergy & Precautions, NPO status , Patient's Chart, lab work & pertinent test results  Airway Mallampati: II  TM Distance: >3 FB     Dental   Pulmonary former smoker,    breath sounds clear to auscultation       Cardiovascular hypertension, + CAD   Rhythm:Regular Rate:Normal     Neuro/Psych    GI/Hepatic negative GI ROS,   Endo/Other  negative endocrine ROS  Renal/GU negative Renal ROS     Musculoskeletal   Abdominal   Peds  Hematology negative hematology ROS (+)   Anesthesia Other Findings   Reproductive/Obstetrics                             Anesthesia Physical Anesthesia Plan  ASA: III  Anesthesia Plan: MAC and Bier Block   Post-op Pain Management:    Induction: Intravenous  Airway Management Planned: Simple Face Mask  Additional Equipment:   Intra-op Plan:   Post-operative Plan:   Informed Consent: I have reviewed the patients History and Physical, chart, labs and discussed the procedure including the risks, benefits and alternatives for the proposed anesthesia with the patient or authorized representative who has indicated his/her understanding and acceptance.   Dental advisory given  Plan Discussed with: CRNA and Anesthesiologist  Anesthesia Plan Comments:         Anesthesia Quick Evaluation

## 2016-09-16 NOTE — Transfer of Care (Signed)
Immediate Anesthesia Transfer of Care Note  Patient: Erin [name]  Procedure(s) Performed: Procedure(s) with comments: RIGHT TRIGGER FINGER RELEASE RING FINGER AND SMALL FINGER (Right) - RIGHT TRIGGER FINGER RELEASE RING FINGER AND SMALL FINGER  Patient Location: PACU  Anesthesia Type:General  Level of Consciousness: awake  Airway & Oxygen Therapy: Patient Spontanous Breathing and Patient connected to face mask oxygen  Post-op Assessment: Report given to RN and Post -op Vital signs reviewed and stable  Post vital signs: Reviewed and stable  Last Vitals:  Vitals:   09/16/16 1036 09/16/16 1301  BP: (!) 148/81   Pulse: 64 88  Resp: 18 17  Temp: 36.5 C     Last Pain:  Vitals:   09/16/16 1036  TempSrc: Oral         Complications: No apparent anesthesia complications

## 2016-09-16 NOTE — Interval H&P Note (Signed)
History and Physical Interval Note:  09/16/2016 12:15 PM  Erin [name]  has presented today for surgery, with the diagnosis of [place]  The various methods of treatment have been discussed with the patient and family. After consideration of risks, benefits and other options for treatment, the patient has consented to  Procedure(s) with comments: RIGHT TRIGGER FINGER RELEASE RING FINGER AND SMALL FINGER (Right) - [place] as a surgical intervention .  The patient's history has been reviewed, patient examined, no change in status, stable for surgery.  I have reviewed the patient's chart and labs.  Questions were answered to the patient's satisfaction.     Erin [name]

## 2016-09-16 NOTE — Anesthesia Procedure Notes (Signed)
Procedure Name: LMA Insertion Date/Time: 09/16/2016 12:29 PM Performed by: Lieutenant Diego Pre-anesthesia Checklist: Patient identified, Emergency Drugs available, Suction available and Patient being monitored Patient Re-evaluated:Patient Re-evaluated prior to inductionOxygen Delivery Method: Circle system utilized Preoxygenation: Pre-oxygenation with 100% oxygen Intubation Type: IV induction Ventilation: Mask ventilation without difficulty LMA: LMA inserted LMA Size: 4.0 Number of attempts: 1 Airway Equipment and Method: Bite block Placement Confirmation: positive ETCO2 and breath sounds checked- equal and bilateral Tube secured with: Tape Dental Injury: Teeth and Oropharynx as per pre-operative assessment

## 2016-09-16 NOTE — Anesthesia Postprocedure Evaluation (Signed)
Anesthesia Post Note  Patient: Erin [name]  Procedure(s) Performed: Procedure(s) (LRB): RIGHT TRIGGER FINGER RELEASE RING FINGER AND SMALL FINGER (Right)  Patient location during evaluation: PACU Anesthesia Type: MAC Level of consciousness: awake Pain management: pain level controlled Vital Signs Assessment: post-procedure vital signs reviewed and stable Respiratory status: spontaneous breathing Cardiovascular status: stable Anesthetic complications: no       Last Vitals:  Vitals:   09/16/16 1315 09/16/16 1339  BP: (!) 143/83 (!) 150/79  Pulse: 72 80  Resp: 14 18  Temp:  36.6 C    Last Pain:  Vitals:   09/16/16 1339  TempSrc:   PainSc: 0-No pain                 Minh Roanhorse

## 2016-09-16 NOTE — Discharge Instructions (Signed)
Do not remove bandage.  Do not get bandage wet.  May apply ice for up to 20 min at a time for pain and swelling.  Fu appt in one week.   Call your surgeon if you experience:   1.  Fever over 101.0. 2.  Inability to urinate. 3.  Nausea and/or vomiting. 4.  Extreme swelling or bruising at the surgical site. 5.  Continued bleeding from the incision. 6.  Increased pain, redness or drainage from the incision. 7.  Problems related to your pain medication. 8.  Any problems and/or concerns   Post Anesthesia Home Care Instructions  Activity: Get plenty of rest for the remainder of the day. A responsible adult should stay with you for 24 hours following the procedure.  For the next 24 hours, DO NOT: -Drive a car -Paediatric nurse -Drink alcoholic beverages -Take any medication unless instructed by your physician -Make any legal decisions or sign important papers.  Meals: Start with liquid foods such as gelatin or soup. Progress to regular foods as tolerated. Avoid greasy, spicy, heavy foods. If nausea and/or vomiting occur, drink only clear liquids until the nausea and/or vomiting subsides. Call your physician if vomiting continues.  Special Instructions/Symptoms: Your throat may feel dry or sore from the anesthesia or the breathing tube placed in your throat during surgery. If this causes discomfort, gargle with warm salt water. The discomfort should disappear within 24 hours.  If you had a scopolamine patch placed behind your ear for the management of post- operative nausea and/or vomiting:  1. The medication in the patch is effective for 72 hours, after which it should be removed.  Wrap patch in a tissue and discard in the trash. Wash hands thoroughly with soap and water. 2. You may remove the patch earlier than 72 hours if you experience unpleasant side effects which may include dry mouth, dizziness or visual disturbances. 3. Avoid touching the patch. Wash your hands with soap and  water after contact with the patch.

## 2016-09-17 ENCOUNTER — Encounter (HOSPITAL_BASED_OUTPATIENT_CLINIC_OR_DEPARTMENT_OTHER): Payer: Self-pay | Admitting: Orthopedic Surgery

## 2016-09-17 NOTE — Op Note (Signed)
[name], Erin [name]                  ACCOUNT NO.:  [id number]  MEDICAL RECORD NO.:  [id number]  LOCATION:                                 FACILITY:  PHYSICIAN:  Ninetta Lights, M.D. DATE OF BIRTH:  27-Oct-1955  DATE OF PROCEDURE:  09/16/2016 DATE OF DISCHARGE:                              OPERATIVE REPORT   PREOPERATIVE DIAGNOSIS:  Symptomatic triggering, A1 pulley, right hand, ring finger and middle finger.  POSTOPERATIVE DIAGNOSIS:  Symptomatic triggering, A1 pulley, right hand, ring finger and middle finger.  PROCEDURE:  Release of A1 pulley, right hand, ring finger.  Release of A1 pulley, right hand, fifth little finger.  SURGEON:  Ninetta Lights, M.D.  ASSISTANT:  Elmyra Ricks, PA.  ANESTHESIA:  General.  BLOOD LOSS:  Minimal.  SPECIMENS:  None.  CULTURES:  None.  COMPLICATIONS:  None.  DRESSINGS:  Soft compressive with bulky hand dressing.  TOURNIQUET TIME:  20 minutes.  DESCRIPTION OF PROCEDURE:  The patient was brought to the operating room and after adequate anesthesia had been obtained, tourniquet applied upper aspect of the arm.  Prepped and draped in usual sterile fashion. Exsanguinated with elevation of Esmarch.  Tourniquet inflated to 250 mmHg.  Small transverse incision centered over and between the fourth and fifth finger, right hand.  Skin and subcutaneous tissue divided. Neurovascular bundles identified, protected.  A1 pulley released under direct visualization in its entirety, ring and little finger.  Fair amount of tenosynovium fluid and nodularity of the tendon.  All triggering obliterated.  Wound irrigated, closed with nylon.  Margins were injected with Marcaine.  Sterile compressive dressing applied.  Bulky hand dressing.  Tourniquet deflated and removed.  Anesthesia reversed.  Brought to the recovery room.  Tolerated the surgery well.  No complications.     Ninetta Lights, M.D.   ______________________________ Ninetta Lights, M.D.    DFM/MEDQ  D:  09/16/2016  T:  09/17/2016  Job:  [id number]

## 2016-12-21 DIAGNOSIS — Z01419 Encounter for gynecological examination (general) (routine) without abnormal findings: Secondary | ICD-10-CM | POA: Diagnosis not present

## 2016-12-21 DIAGNOSIS — Z6822 Body mass index (BMI) 22.0-22.9, adult: Secondary | ICD-10-CM | POA: Diagnosis not present

## 2017-02-23 DIAGNOSIS — Z1231 Encounter for screening mammogram for malignant neoplasm of breast: Secondary | ICD-10-CM | POA: Diagnosis not present

## 2017-05-05 ENCOUNTER — Encounter: Payer: Self-pay | Admitting: Podiatry

## 2017-05-05 ENCOUNTER — Ambulatory Visit (INDEPENDENT_AMBULATORY_CARE_PROVIDER_SITE_OTHER): Payer: 59 | Admitting: Podiatry

## 2017-05-05 VITALS — BP 164/93 | HR 68 | Resp 18

## 2017-05-05 DIAGNOSIS — L603 Nail dystrophy: Secondary | ICD-10-CM

## 2017-05-05 DIAGNOSIS — B351 Tinea unguium: Secondary | ICD-10-CM

## 2017-05-05 DIAGNOSIS — L608 Other nail disorders: Secondary | ICD-10-CM | POA: Diagnosis not present

## 2017-05-05 NOTE — Progress Notes (Signed)
   Subjective:    Patient ID: Erin [name], female    DOB: 1956/06/24, 61 y.o.   MRN: [id number]  HPI  Ms. Strader Presents the office today for concerns of the split to her left big toenail which is been ongoing for about 2 years. She states that in the summertime she wears nail polish all the time and when she does that she is worsening as that her big toenail splits and is somewhat discolored. She's not sure if she has a fungus or what is going on the toenail. She denies any pain to the nail she denies any surrounding redness or drainage or other signs of infection. She said no recent treatment for this. She has no other concerns.  Review of Systems  All other systems reviewed and are negative.      Objective:   Physical Exam  General: AAO x3, NAD  Dermatological: Left hallux toenail appears to have a central area where she has cut her nails appears to be a U-shaped essential areas been cut out. This. We thickened toenails with yellow to brown discoloration of the nail. There is a small evidence of some old hematoma or old blood along the area. There is no extension of any hyperpigmentation to the surrounding skin. There is no pain of the nail there is no swelling redness or drainage or any swelling.  Vascular: Dorsalis Pedis artery and Posterior Tibial artery pedal pulses are 2/4 bilateral with immedate capillary fill time. Pedal hair growth present. There is no pain with calf compression, swelling, warmth, erythema.   Neruologic: Grossly intact via light touch bilateral.Protective threshold with Semmes Wienstein monofilament intact to all pedal sites bilateral.   Musculoskeletal: No gross boney pedal deformities bilateral. No pain, crepitus, or limitation noted with foot and ankle range of motion bilateral. Muscular strength 5/5 in all groups tested bilateral.  Gait: Unassisted, Nonantalgic.      Assessment & Plan:  61 year old female with left hallux onychodystrophy likely  onychomycosis -Treatment options discussed including all alternatives, risks, and complications -Etiology of symptoms were discussed -Discussed that with time also with time this is likely fungus. I did debride the nail and sent this for cultures/pathology to [facility] labs for evaluation. We discussed treatment for onychomycosis we will await the results the culture before proceeding with definitive treatment and she agrees to this plan. Discussed her nail avulsion but is not painful is no signs of infection such I to hold off on this at possible.  Celesta Gentile, DPM

## 2017-05-24 DIAGNOSIS — R7301 Impaired fasting glucose: Secondary | ICD-10-CM | POA: Diagnosis not present

## 2017-05-24 DIAGNOSIS — Z0000 Encounter for general adult medical examination without abnormal findings: Secondary | ICD-10-CM | POA: Diagnosis not present

## 2017-05-24 DIAGNOSIS — E559 Vitamin D deficiency, unspecified: Secondary | ICD-10-CM | POA: Diagnosis not present

## 2017-05-25 ENCOUNTER — Telehealth: Payer: Self-pay | Admitting: *Deleted

## 2017-05-25 NOTE — Telephone Encounter (Addendum)
-----   Message from Trula Slade, DPM sent at 05/23/2017  7:05 AM EDT ----- Negative for fungus- please let her know. Thanks.05/25/2017-I informed pt of Dr. Leigh Aurora review of results.

## 2017-05-31 DIAGNOSIS — R7301 Impaired fasting glucose: Secondary | ICD-10-CM | POA: Diagnosis not present

## 2017-05-31 DIAGNOSIS — Z0000 Encounter for general adult medical examination without abnormal findings: Secondary | ICD-10-CM | POA: Diagnosis not present

## 2017-05-31 DIAGNOSIS — I6529 Occlusion and stenosis of unspecified carotid artery: Secondary | ICD-10-CM | POA: Diagnosis not present

## 2017-05-31 DIAGNOSIS — R05 Cough: Secondary | ICD-10-CM | POA: Diagnosis not present

## 2017-06-01 DIAGNOSIS — Z23 Encounter for immunization: Secondary | ICD-10-CM | POA: Diagnosis not present

## 2017-06-03 DIAGNOSIS — Z1212 Encounter for screening for malignant neoplasm of rectum: Secondary | ICD-10-CM | POA: Diagnosis not present

## 2017-06-22 DIAGNOSIS — L821 Other seborrheic keratosis: Secondary | ICD-10-CM | POA: Diagnosis not present

## 2017-06-22 DIAGNOSIS — C44511 Basal cell carcinoma of skin of breast: Secondary | ICD-10-CM | POA: Diagnosis not present

## 2017-06-22 DIAGNOSIS — L814 Other melanin hyperpigmentation: Secondary | ICD-10-CM | POA: Diagnosis not present

## 2017-06-22 DIAGNOSIS — D225 Melanocytic nevi of trunk: Secondary | ICD-10-CM | POA: Diagnosis not present

## 2017-06-22 DIAGNOSIS — D485 Neoplasm of uncertain behavior of skin: Secondary | ICD-10-CM | POA: Diagnosis not present

## 2017-07-04 NOTE — Progress Notes (Signed)
Cardiology Office Note    Date:  07/05/2017   ID:  Erin, [name] Dec 17, 1955, MRN [id number]  PCP:  Crist Infante, MD  Cardiologist: Dr. Acie Fredrickson  Chief Complaint: Yearly follow-up  History of Present Illness:   Erin [name] is a 61 y.o. female with a history of functional bicuspid aortic valve, carotid disease and hyperlipidemia presents for follow-up.  He was doing well on a cardiac standpoint when last seen by Dr. Acie Fredrickson November 2017.  Here today for follow up. She does boot camp exercise twice a week without any issue. Each for 45 minutes. Her BP usually runs in 135/80s at home. Here 154/92. Repeat check 144/84. The patient denies nausea, vomiting, fever, chest pain, palpitations, shortness of breath, orthopnea, PND, dizziness, syncope, cough, congestion, abdominal pain, hematochezia, melena, lower extremity edema. Mother had aortic aneurysm.    Past Medical History:  Diagnosis Date  . Coronary artery disease    mild, per cardiology note  . Dental crowns present   . Hyperlipidemia   . Hypertension    states under control with med., has been on med. x 12 yr.  . Tricuspid but functionally bicuspid aortic valve    congenital, per pt. - is checked yearly by cardiologist  . Trigger ring finger of left hand 10/2015    Past Surgical History:  Procedure Laterality Date  . ABDOMINAL HYSTERECTOMY     complete  . APPENDECTOMY    . CHOLECYSTECTOMY    . FOOT GANGLION EXCISION Left 04/09/2004  . INCISIONAL HERNIA REPAIR Right 04/28/2004   RUQ ventral  . LEFT RING FINGER RELEASE TRIGGER FINGER/A-1 PULLEY Left 11/13/2015   Performed by Ninetta Lights, MD at [facility]  . MINOR IRRIGATION AND DEBRIDEMENT OF WOUND Left 07/13/2006   elbow  . OLECRANON BURSA EXCISION Left 02/02/2006  . RIGHT TRIGGER FINGER RELEASE RING FINGER AND SMALL FINGER Right 09/16/2016   Performed by Ninetta Lights, MD at [facility]  . ROTATOR CUFF REPAIR Bilateral      Current Medications: Prior to Admission medications   Medication Sig Start Date End Date Taking? Authorizing Provider  aspirin 81 MG tablet Take 81 mg by mouth. Once a week    [provider]  benazepril (LOTENSIN) 40 MG tablet Take 40 mg by mouth daily.    [provider]  ezetimibe (ZETIA) 10 MG tablet Take 10 mg by mouth daily. 06/26/16   [provider]  fluticasone (FLONASE) 50 MCG/ACT nasal spray Place 1 spray into both nostrils daily. 06/26/16   [provider]  ondansetron (ZOFRAN) 4 MG tablet Take 1 tablet (4 mg total) by mouth every 8 (eight) hours as needed for nausea or vomiting. Patient not taking: Reported on 05/05/2017 09/16/16   Aundra Dubin, PA-C  oxyCODONE-acetaminophen (PERCOCET) 5-325 MG tablet Take 1-2 tablets by mouth every 4 (four) hours as needed for severe pain. Patient not taking: Reported on 05/05/2017 09/16/16   Aundra Dubin, PA-C  rosuvastatin (CRESTOR) 20 MG tablet Take 20 mg by mouth daily.    [provider]  valACYclovir (VALTREX) 1000 MG tablet Take 1 g by mouth daily as needed. AS NEEDED FOR COLD SORES 06/16/16   [provider]    Allergies:   Lipitor [atorvastatin calcium]   Social History   Socioeconomic History  . Marital status: Married    Spouse name: None  . Number of children: None  . Years of education: None  . Highest  education level: None  Social Needs  . Financial resource strain: None  . Food insecurity - worry: None  . Food insecurity - inability: None  . Transportation needs - medical: None  . Transportation needs - non-medical: None  Occupational History  . None  Tobacco Use  . Smoking status: Former Smoker    Last attempt to quit: 06/11/2010    Years since quitting: 7.0  . Smokeless tobacco: Never Used  Substance and Sexual Activity  . Alcohol use: No  . Drug use: No  . Sexual activity: None  Other Topics Concern  . None  Social History Narrative  . None      Family History:  The patient's family history is not on file.   ROS:   Please see the history of present illness.    ROS All other systems reviewed and are negative.   PHYSICAL EXAM:   VS:  BP (!) 154/92   Pulse 67   Ht 5\' 2"  (1.575 m)   Wt 127 lb 12.8 oz (58 kg)   SpO2 95%   BMI 23.37 kg/m    GEN: Well nourished, well developed, in no acute distress  HEENT: normaa Neck: no JVD. Carotid bruits bilaterally R > L   Cardiac: RRR; Soft systolic  murmurs, rubs, or gallops,no edema  Respiratory:  clear to auscultation bilaterally, normal work of breathing GI: soft, nontender, nondistended, + BS. Abdominal bruit on R side MS: no deformity or atrophy  Skin: warm and dry, no rash Neuro:  Alert and Oriented x 3, Strength and sensation are intact Psych: euthymic mood, full affect  Wt Readings from Last 3 Encounters:  07/05/17 127 lb 12.8 oz (58 kg)  09/16/16 126 lb (57.2 kg)  07/13/16 125 lb 1.9 oz (56.8 kg)      Studies/Labs Reviewed:   EKG:  EKG is ordered today.  The ekg ordered today demonstrates SR at rate of 67 bpm  Recent Labs: 07/13/2016: ALT 21; BUN 14; Creat 0.74; Potassium 4.3; Sodium 139   Lipid Panel    Component Value Date/Time   CHOL 148 07/13/2016 1037   CHOL 147 07/13/2016 1037   TRIG 138 07/13/2016 1037   HDL 51 07/13/2016 1037   CHOLHDL 2.9 07/13/2016 1037   VLDL 28 07/13/2016 1037   LDLCALC 68 07/13/2016 1037    Additional studies/ records that were reviewed today include:   Echocardiogram: 07/2013 Study Conclusions  - Left ventricle: The cavity size was normal. Wall thickness was normal. Systolic function was normal. The estimated ejection fraction was in the range of 55% to 60%. Wall motion was normal; there were no regional wall motion abnormalities. Features are consistent with a pseudonormal left ventricular filling pattern, with concomitant abnormal relaxation and increased filling pressure (grade 2 diastolic  dysfunction). - Aortic valve: Trileaflet; mildly calcified leaflets. There was no stenosis. - Mitral valve: Trivial regurgitation. - Left atrium: The atrium was mildly dilated. - Right ventricle: The cavity size was normal. Systolic function was normal. - Tricuspid valve: Peak RV-RA gradient: 58mm Hg (S). - Pulmonary arteries: PA peak pressure: 69mm Hg (S). - Inferior vena cava: The vessel was normal in size; the respirophasic diameter changes were in the normal range (= 50%); findings are consistent with normal central venous pressure. Impressions:  - Normal LV size and systolic function, EF 06-23%. The aortic valve is mildly calcified but there is no stenosis or regurgitation. Normal RV size and systolic function.   Carotid Doppler 07/2015 -Mild disease bilaterally.  Repeat in 2-year.  Renal artery Doppler 07/2016 -Moderate renal artery disease.  Repeat in 1 year.   ASSESSMENT & PLAN:    1. Carotid artery disease -Asymptomatic. Will repeat study.   2. Renal artery stenosis - Will repeat study.   3. HLD - LDL 76 on 05/24/17. Continue statin.  4. HTN - intermittently elevated. Given renal artery stenosis and family hx of aortic aneurysm will aim for tight blood pressure control. Continue Benazepril 40mg  qd. Add amlodipine 2.5mg  qd.   5. Family  hx of aortic aneurysm  - Tight BP control as above. Will evaluate in future.    Medication Adjustments/Labs and Tests Ordered: Current medicines are reviewed at length with the patient today.  Concerns regarding medicines are outlined above.  Medication changes, Labs and Tests ordered today are listed in the Patient Instructions below. Patient Instructions  Medication Instructions:  1. START NORVASC 2.5 MG 1 TABLET ONCE A DAY; RX HAS BEEN SENT IN  Labwork: NONE ORDERED TODAY  Testing/Procedures: 1. Your physician has requested that you have a carotid duplex. This test is an ultrasound of the carotid arteries  in your neck. It looks at blood flow through these arteries that supply the brain with blood. Allow one hour for this exam. There are no restrictions or special instructions.  2. Your physician has requested that you have a renal artery duplex. During this test, an ultrasound is used to evaluate blood flow to the kidneys. Allow one hour for this exam. Do not eat after midnight the day before and avoid carbonated beverages. Take your medications as you usually do.    Follow-Up: Your physician wants you to follow-up in: [place] Acie Fredrickson You will receive a reminder letter in the mail two months in advance. If you don't receive a letter, please call our office to schedule the follow-up appointment.   Any Other Special Instructions Will Be Listed Below (If Applicable).     If you need a refill on your cardiac medications before your next appointment, please call your pharmacy.      Jarrett Soho, [place]  07/05/2017 8:49 AM    [facility] [place], [place], [place]  [address] Phone: [phone]; Fax: [phone]

## 2017-07-05 ENCOUNTER — Ambulatory Visit (INDEPENDENT_AMBULATORY_CARE_PROVIDER_SITE_OTHER): Payer: 59 | Admitting: Physician Assistant

## 2017-07-05 ENCOUNTER — Encounter: Payer: Self-pay | Admitting: Physician Assistant

## 2017-07-05 VITALS — BP 154/92 | HR 67 | Ht 62.0 in | Wt 127.8 lb

## 2017-07-05 DIAGNOSIS — I2510 Atherosclerotic heart disease of native coronary artery without angina pectoris: Secondary | ICD-10-CM

## 2017-07-05 DIAGNOSIS — E782 Mixed hyperlipidemia: Secondary | ICD-10-CM

## 2017-07-05 DIAGNOSIS — R0989 Other specified symptoms and signs involving the circulatory and respiratory systems: Secondary | ICD-10-CM | POA: Diagnosis not present

## 2017-07-05 DIAGNOSIS — I6523 Occlusion and stenosis of bilateral carotid arteries: Secondary | ICD-10-CM

## 2017-07-05 DIAGNOSIS — I701 Atherosclerosis of renal artery: Secondary | ICD-10-CM

## 2017-07-05 DIAGNOSIS — I10 Essential (primary) hypertension: Secondary | ICD-10-CM | POA: Diagnosis not present

## 2017-07-05 MED ORDER — AMLODIPINE BESYLATE 2.5 MG PO TABS: 2.5000 mg | ORAL_TABLET | Freq: Every day | ORAL | 3 refills | Status: DC

## 2017-07-05 NOTE — Patient Instructions (Signed)
Medication Instructions:  1. START NORVASC 2.5 MG 1 TABLET ONCE A DAY; RX HAS BEEN SENT IN  Labwork: NONE ORDERED TODAY  Testing/Procedures: 1. Your physician has requested that you have a carotid duplex. This test is an ultrasound of the carotid arteries in your neck. It looks at blood flow through these arteries that supply the brain with blood. Allow one hour for this exam. There are no restrictions or special instructions.  2. Your physician has requested that you have a renal artery duplex. During this test, an ultrasound is used to evaluate blood flow to the kidneys. Allow one hour for this exam. Do not eat after midnight the day before and avoid carbonated beverages. Take your medications as you usually do.    Follow-Up: Your physician wants you to follow-up in: [place] Acie Fredrickson You will receive a reminder letter in the mail two months in advance. If you don't receive a letter, please call our office to schedule the follow-up appointment.   Any Other Special Instructions Will Be Listed Below (If Applicable).     If you need a refill on your cardiac medications before your next appointment, please call your pharmacy.

## 2017-07-22 DIAGNOSIS — C44511 Basal cell carcinoma of skin of breast: Secondary | ICD-10-CM | POA: Diagnosis not present

## 2017-07-27 DIAGNOSIS — M25562 Pain in left knee: Secondary | ICD-10-CM | POA: Diagnosis not present

## 2017-08-08 ENCOUNTER — Ambulatory Visit (HOSPITAL_BASED_OUTPATIENT_CLINIC_OR_DEPARTMENT_OTHER)
Admission: RE | Admit: 2017-08-08 | Discharge: 2017-08-08 | Disposition: A | Discharge status: Home / Self Care | Payer: 59 | Source: Ambulatory Visit | Attending: Physician Assistant | Admitting: Physician Assistant

## 2017-08-08 ENCOUNTER — Ambulatory Visit (HOSPITAL_COMMUNITY)
Admission: RE | Admit: 2017-08-08 | Discharge: 2017-08-08 | Disposition: A | Discharge status: Home / Self Care | Payer: 59 | Source: Ambulatory Visit | Attending: Cardiovascular Disease | Admitting: Cardiovascular Disease

## 2017-08-08 DIAGNOSIS — R0989 Other specified symptoms and signs involving the circulatory and respiratory systems: Secondary | ICD-10-CM | POA: Insufficient documentation

## 2017-08-08 DIAGNOSIS — I701 Atherosclerosis of renal artery: Secondary | ICD-10-CM | POA: Insufficient documentation

## 2017-08-08 DIAGNOSIS — I6523 Occlusion and stenosis of bilateral carotid arteries: Secondary | ICD-10-CM | POA: Diagnosis not present

## 2017-09-15 DIAGNOSIS — Z9889 Other specified postprocedural states: Secondary | ICD-10-CM | POA: Diagnosis not present

## 2017-09-15 DIAGNOSIS — C44519 Basal cell carcinoma of skin of other part of trunk: Secondary | ICD-10-CM | POA: Diagnosis not present

## 2017-09-15 DIAGNOSIS — L905 Scar conditions and fibrosis of skin: Secondary | ICD-10-CM | POA: Diagnosis not present

## 2017-09-15 DIAGNOSIS — C44511 Basal cell carcinoma of skin of breast: Secondary | ICD-10-CM | POA: Diagnosis not present

## 2017-12-21 DIAGNOSIS — D225 Melanocytic nevi of trunk: Secondary | ICD-10-CM | POA: Diagnosis not present

## 2017-12-21 DIAGNOSIS — D485 Neoplasm of uncertain behavior of skin: Secondary | ICD-10-CM | POA: Diagnosis not present

## 2017-12-21 DIAGNOSIS — L814 Other melanin hyperpigmentation: Secondary | ICD-10-CM | POA: Diagnosis not present

## 2017-12-21 DIAGNOSIS — L439 Lichen planus, unspecified: Secondary | ICD-10-CM | POA: Diagnosis not present

## 2017-12-21 DIAGNOSIS — L821 Other seborrheic keratosis: Secondary | ICD-10-CM | POA: Diagnosis not present

## 2018-02-28 DIAGNOSIS — Z1231 Encounter for screening mammogram for malignant neoplasm of breast: Secondary | ICD-10-CM | POA: Diagnosis not present

## 2018-03-09 ENCOUNTER — Encounter (HOSPITAL_COMMUNITY): Payer: Self-pay | Admitting: Emergency Medicine

## 2018-03-09 ENCOUNTER — Observation Stay (HOSPITAL_COMMUNITY): Payer: 59

## 2018-03-09 ENCOUNTER — Observation Stay (HOSPITAL_COMMUNITY)
Admission: EM | Admit: 2018-03-09 | Discharge: 2018-03-10 | Disposition: A | Discharge status: Home / Self Care | Payer: 59 | Attending: Cardiology | Admitting: Cardiology

## 2018-03-09 ENCOUNTER — Emergency Department (HOSPITAL_COMMUNITY): Payer: 59

## 2018-03-09 ENCOUNTER — Other Ambulatory Visit: Payer: Self-pay

## 2018-03-09 DIAGNOSIS — R55 Syncope and collapse: Secondary | ICD-10-CM | POA: Diagnosis not present

## 2018-03-09 DIAGNOSIS — R0989 Other specified symptoms and signs involving the circulatory and respiratory systems: Secondary | ICD-10-CM | POA: Diagnosis present

## 2018-03-09 DIAGNOSIS — I2510 Atherosclerotic heart disease of native coronary artery without angina pectoris: Secondary | ICD-10-CM | POA: Insufficient documentation

## 2018-03-09 DIAGNOSIS — Z7982 Long term (current) use of aspirin: Secondary | ICD-10-CM | POA: Insufficient documentation

## 2018-03-09 DIAGNOSIS — I10 Essential (primary) hypertension: Secondary | ICD-10-CM | POA: Diagnosis not present

## 2018-03-09 DIAGNOSIS — E785 Hyperlipidemia, unspecified: Secondary | ICD-10-CM | POA: Diagnosis not present

## 2018-03-09 DIAGNOSIS — R0789 Other chest pain: Secondary | ICD-10-CM | POA: Diagnosis not present

## 2018-03-09 DIAGNOSIS — Q231 Congenital insufficiency of aortic valve: Secondary | ICD-10-CM | POA: Diagnosis not present

## 2018-03-09 DIAGNOSIS — I700 Atherosclerosis of aorta: Secondary | ICD-10-CM | POA: Diagnosis not present

## 2018-03-09 DIAGNOSIS — M419 Scoliosis, unspecified: Secondary | ICD-10-CM | POA: Diagnosis not present

## 2018-03-09 DIAGNOSIS — R079 Chest pain, unspecified: Secondary | ICD-10-CM

## 2018-03-09 DIAGNOSIS — Z8249 Family history of ischemic heart disease and other diseases of the circulatory system: Secondary | ICD-10-CM | POA: Diagnosis not present

## 2018-03-09 DIAGNOSIS — Z79899 Other long term (current) drug therapy: Secondary | ICD-10-CM | POA: Insufficient documentation

## 2018-03-09 DIAGNOSIS — Z87891 Personal history of nicotine dependence: Secondary | ICD-10-CM | POA: Insufficient documentation

## 2018-03-09 DIAGNOSIS — I359 Nonrheumatic aortic valve disorder, unspecified: Secondary | ICD-10-CM | POA: Diagnosis present

## 2018-03-09 HISTORY — DX: Chest pain, unspecified: R07.9

## 2018-03-09 HISTORY — DX: Nonrheumatic aortic valve disorder, unspecified: I35.9

## 2018-03-09 LAB — MRSA PCR SCREENING: MRSA BY PCR: NEGATIVE

## 2018-03-09 LAB — T4, FREE: FREE T4: 1.23 ng/dL (ref 0.82–1.77)

## 2018-03-09 LAB — COMPREHENSIVE METABOLIC PANEL
ALT: 13 U/L (ref 0–44)
ANION GAP: 9 (ref 5–15)
AST: 16 U/L (ref 15–41)
Albumin: 3.7 g/dL (ref 3.5–5.0)
Alkaline Phosphatase: 76 U/L (ref 38–126)
BUN: 17 mg/dL (ref 6–20)
CO2: 24 mmol/L (ref 22–32)
Calcium: 8.9 mg/dL (ref 8.9–10.3)
Chloride: 108 mmol/L (ref 98–111)
Creatinine, Ser: 0.86 mg/dL (ref 0.44–1.00)
Glucose, Bld: 98 mg/dL (ref 70–99)
Potassium: 3.2 mmol/L — ABNORMAL LOW (ref 3.5–5.1)
SODIUM: 141 mmol/L (ref 135–145)
Total Bilirubin: 0.6 mg/dL (ref 0.3–1.2)
Total Protein: 6.2 g/dL — ABNORMAL LOW (ref 6.5–8.1)

## 2018-03-09 LAB — CBC WITH DIFFERENTIAL/PLATELET
Abs Immature Granulocytes: 0 10*3/uL (ref 0.0–0.1)
BASOS ABS: 0.1 10*3/uL (ref 0.0–0.1)
BASOS PCT: 1 %
Eosinophils Absolute: 0 10*3/uL (ref 0.0–0.7)
Eosinophils Relative: 0 %
HEMATOCRIT: 39.8 % (ref 36.0–46.0)
Hemoglobin: 13.3 g/dL (ref 12.0–15.0)
IMMATURE GRANULOCYTES: 0 %
LYMPHS ABS: 2 10*3/uL (ref 0.7–4.0)
Lymphocytes Relative: 20 %
MCH: 29.7 pg (ref 26.0–34.0)
MCHC: 33.4 g/dL (ref 30.0–36.0)
MCV: 88.8 fL (ref 78.0–100.0)
Monocytes Absolute: 0.7 10*3/uL (ref 0.1–1.0)
Monocytes Relative: 7 %
NEUTROS PCT: 72 %
Neutro Abs: 7.2 10*3/uL (ref 1.7–7.7)
PLATELETS: 230 10*3/uL (ref 150–400)
RBC: 4.48 MIL/uL (ref 3.87–5.11)
RDW: 13.1 % (ref 11.5–15.5)
WBC: 10 10*3/uL (ref 4.0–10.5)

## 2018-03-09 LAB — TROPONIN I

## 2018-03-09 LAB — TSH: TSH: 0.283 u[IU]/mL — ABNORMAL LOW (ref 0.350–4.500)

## 2018-03-09 LAB — HEMOGLOBIN A1C
HEMOGLOBIN A1C: 5.9 % — AB (ref 4.8–5.6)
Mean Plasma Glucose: 122.63 mg/dL

## 2018-03-09 MED ORDER — NITROGLYCERIN 0.4 MG SL SUBL: 0.4000 mg | SUBLINGUAL_TABLET | SUBLINGUAL | Status: DC | PRN

## 2018-03-09 MED ORDER — ASPIRIN 81 MG PO TABS: 81.0000 mg | ORAL_TABLET | ORAL | Status: DC

## 2018-03-09 MED ORDER — ROSUVASTATIN CALCIUM 20 MG PO TABS
20.0000 mg | ORAL_TABLET | Freq: Every day | ORAL | Status: DC
  Administered 2018-03-09 – 2018-03-10 (×2): 20 mg via ORAL
  Filled 2018-03-09 (×2): qty 1

## 2018-03-09 MED ORDER — METOPROLOL TARTRATE 5 MG/5ML IV SOLN
INTRAVENOUS | Status: AC
  Filled 2018-03-09: qty 10

## 2018-03-09 MED ORDER — BENAZEPRIL HCL 40 MG PO TABS
40.0000 mg | ORAL_TABLET | Freq: Every day | ORAL | Status: DC
  Administered 2018-03-09: 40 mg via ORAL
  Filled 2018-03-09 (×2): qty 1

## 2018-03-09 MED ORDER — HEPARIN SODIUM (PORCINE) 5000 UNIT/ML IJ SOLN
5000.0000 [IU] | Freq: Three times a day (TID) | INTRAMUSCULAR | Status: DC
  Administered 2018-03-09 – 2018-03-10 (×2): 5000 [IU] via SUBCUTANEOUS
  Filled 2018-03-09 (×2): qty 1

## 2018-03-09 MED ORDER — SODIUM CHLORIDE 0.9 % IV SOLN
INTRAVENOUS | Status: DC
  Administered 2018-03-09: 17:00:00 via INTRAVENOUS

## 2018-03-09 MED ORDER — ONDANSETRON HCL 4 MG/2ML IJ SOLN: 4.0000 mg | Freq: Four times a day (QID) | INTRAMUSCULAR | Status: DC | PRN

## 2018-03-09 MED ORDER — NITROGLYCERIN 0.4 MG SL SUBL
0.8000 mg | SUBLINGUAL_TABLET | Freq: Once | SUBLINGUAL | Status: AC
  Administered 2018-03-09: 0.8 mg via SUBLINGUAL

## 2018-03-09 MED ORDER — IOPAMIDOL (ISOVUE-370) INJECTION 76%
INTRAVENOUS | Status: AC
  Administered 2018-03-09: 16:00:00
  Filled 2018-03-09: qty 100

## 2018-03-09 MED ORDER — IOPAMIDOL (ISOVUE-370) INJECTION 76%: 100.0000 mL | Freq: Once | INTRAVENOUS | Status: DC | PRN

## 2018-03-09 MED ORDER — ASPIRIN 81 MG PO CHEW: 324.0000 mg | CHEWABLE_TABLET | ORAL | Status: DC

## 2018-03-09 MED ORDER — NITROGLYCERIN 0.4 MG SL SUBL
0.4000 mg | SUBLINGUAL_TABLET | SUBLINGUAL | Status: DC | PRN
  Administered 2018-03-09: 0.4 mg via SUBLINGUAL
  Filled 2018-03-09 (×3): qty 1

## 2018-03-09 MED ORDER — ASPIRIN 300 MG RE SUPP: 300.0000 mg | RECTAL | Status: DC

## 2018-03-09 MED ORDER — ACETAMINOPHEN 325 MG PO TABS: 650.0000 mg | ORAL_TABLET | ORAL | Status: DC | PRN

## 2018-03-09 MED ORDER — POTASSIUM CHLORIDE CRYS ER 20 MEQ PO TBCR
40.0000 meq | EXTENDED_RELEASE_TABLET | Freq: Once | ORAL | Status: AC
  Administered 2018-03-09: 40 meq via ORAL
  Filled 2018-03-09: qty 2

## 2018-03-09 MED ORDER — METOPROLOL TARTRATE 25 MG PO TABS
25.0000 mg | ORAL_TABLET | Freq: Once | ORAL | Status: AC
  Administered 2018-03-09: 25 mg via ORAL
  Filled 2018-03-09: qty 1

## 2018-03-09 MED ORDER — EZETIMIBE 10 MG PO TABS
10.0000 mg | ORAL_TABLET | Freq: Every day | ORAL | Status: DC
  Administered 2018-03-09 – 2018-03-10 (×2): 10 mg via ORAL
  Filled 2018-03-09 (×2): qty 1

## 2018-03-09 MED ORDER — FLUTICASONE PROPIONATE 50 MCG/ACT NA SUSP
1.0000 | Freq: Every day | NASAL | Status: DC
Start: 2018-03-09 — End: 2018-03-10
  Administered 2018-03-09 – 2018-03-10 (×2): 1 via NASAL
  Filled 2018-03-09: qty 16

## 2018-03-09 MED ORDER — NITROGLYCERIN 0.4 MG SL SUBL
SUBLINGUAL_TABLET | SUBLINGUAL | Status: AC
  Filled 2018-03-09: qty 2

## 2018-03-09 MED ORDER — METOPROLOL TARTRATE 5 MG/5ML IV SOLN
5.0000 mg | INTRAVENOUS | Status: DC | PRN
  Administered 2018-03-09: 5 mg via INTRAVENOUS

## 2018-03-09 MED ORDER — ASPIRIN EC 81 MG PO TBEC
81.0000 mg | DELAYED_RELEASE_TABLET | Freq: Every day | ORAL | Status: DC
  Administered 2018-03-10: 81 mg via ORAL
  Filled 2018-03-09: qty 1

## 2018-03-09 NOTE — ED Notes (Signed)
Attempted report 
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
## 2018-03-09 NOTE — ED Notes (Signed)
Notified CT patient ready for exam.

## 2018-03-09 NOTE — ED Notes (Signed)
Helped get patient undress into a gown on the monitor patient is resting with call bell in reach

## 2018-03-09 NOTE — ED Notes (Signed)
Pt ordered clear liquid tray for lunch

## 2018-03-09 NOTE — ED Triage Notes (Signed)
Pt arrives via EMS from home with substernal CP radiating to left arm that began 65min PTA. Pt reports recent stressors including death of husband in last 4 months. Pt reports pain has decreased to 1/10. Still reports numbness in left arm. States yesterday had syncopal event, due to dizziness. Woke up on concrete porch yesterday. Pt alert, oriented x4, denies pain or injury related to syncopal event yesterday. Received 324mg  ASA, 1 SL nitro with improvement in pain. EKG SR. Did not take her blood pressure medicine today.

## 2018-03-09 NOTE — ED Provider Notes (Signed)
[place] EMERGENCY DEPARTMENT Provider Note   CSN: [id number] Arrival date & time: 03/09/18  3244     History   Chief Complaint Chief Complaint  Patient presents with  . Chest Pain  . Loss of Consciousness    HPI Erin [name] is a 62 y.o. female.  The history is provided by the patient. No language interpreter was used.  Chest Pain   Associated symptoms include syncope.  Loss of Consciousness   Associated symptoms include chest pain.   Erin [name] is a 62 y.o. female who presents to the Emergency Department complaining of CP. She presents to the emergency department for evaluation of chest pain that began about 30 minutes prior to ED arrival. She was at rest when she developed left sided chest pain described as a pressure type sensation radiating to her shoulder and left upper extremity. She had associated diaphoresis and shortness of breath. Over the last few days she has been feeling poorly with nausea and occasional diarrhea and fatigue. She did have a syncopal episode yesterday when walking in her house. She denies any headache, abdominal pain, vomiting, leg swelling or pain. She has a history of hypertension, hyperlipidemia. She has a family history of aortic aneurysm and coronary artery disease. She took 324 of aspirin prior to EMS arrival. She received one nitroglycerin by EMS with significant improvement in her pain. Past Medical History:  Diagnosis Date  . Coronary artery disease    mild, per cardiology note  . Dental crowns present   . Hyperlipidemia   . Hypertension    states under control with med., has been on med. x 12 yr.  . Tricuspid but functionally bicuspid aortic valve    congenital, per pt. - is checked yearly by cardiologist  . Trigger ring finger of left hand 10/2015    Patient Active Problem List   Diagnosis Date Noted  . Right carotid bruit 07/06/2013  . Aortic stenosis 07/06/2013  . Hyperlipidemia 06/23/2011    Past Surgical  History:  Procedure Laterality Date  . ABDOMINAL HYSTERECTOMY     complete  . APPENDECTOMY    . CHOLECYSTECTOMY    . FOOT GANGLION EXCISION Left 04/09/2004  . INCISIONAL HERNIA REPAIR Right 04/28/2004   RUQ ventral  . MINOR IRRIGATION AND DEBRIDEMENT OF WOUND Left 07/13/2006   elbow  . OLECRANON BURSA EXCISION Left 02/02/2006  . ROTATOR CUFF REPAIR Bilateral   . TRIGGER FINGER RELEASE Left 11/13/2015   Procedure: LEFT RING FINGER RELEASE TRIGGER FINGER/A-1 PULLEY;  Surgeon: Ninetta Lights, MD;  Location: [place];  Service: Orthopedics;  Laterality: Left;  . TRIGGER FINGER RELEASE Right 09/16/2016   Procedure: RIGHT TRIGGER FINGER RELEASE RING FINGER AND SMALL FINGER;  Surgeon: Ninetta Lights, MD;  Location: [place];  Service: Orthopedics;  Laterality: Right;  RIGHT TRIGGER FINGER RELEASE RING FINGER AND SMALL FINGER     OB History   None      Home Medications    Prior to Admission medications   Medication Sig Start Date End Date Taking? Authorizing Provider  amLODipine (NORVASC) 2.5 MG tablet Take 1 tablet (2.5 mg total) daily by mouth. 07/05/17 03/09/18 Yes Bhagat, Bhavinkumar, PA  aspirin 81 MG tablet Take 81 mg by mouth once a week. Once a week   Yes [provider]  azithromycin (ZITHROMAX) 500 MG tablet Take 250-500 mg by mouth See admin instructions. Take two tabs on day one, then take one tab  daily for 4 days. 03/08/18  Yes [provider]  benazepril (LOTENSIN) 40 MG tablet Take 40 mg by mouth daily.   Yes [provider]  ezetimibe (ZETIA) 10 MG tablet Take 10 mg by mouth daily. 06/26/16  Yes [provider]  fluticasone (FLONASE) 50 MCG/ACT nasal spray Place 1 spray into both nostrils daily. 06/26/16  Yes [provider]  rosuvastatin (CRESTOR) 20 MG tablet Take 20 mg by mouth daily.   Yes [provider]  valACYclovir (VALTREX) 1000 MG tablet Take 1 g by mouth daily as needed. AS NEEDED  FOR COLD SORES 06/16/16  Yes [provider]    Family History History reviewed. No pertinent family history.  Social History Social History   Tobacco Use  . Smoking status: Former Smoker    Last attempt to quit: 06/11/2010    Years since quitting: 7.7  . Smokeless tobacco: Never Used  Substance Use Topics  . Alcohol use: No  . Drug use: No     Allergies   Lipitor [atorvastatin calcium]   Review of Systems Review of Systems  Cardiovascular: Positive for chest pain and syncope.  All other systems reviewed and are negative.    Physical Exam Updated Vital Signs BP (!) 144/81   Pulse 82   Temp 99 F (37.2 C) (Oral)   Resp 15   Ht 5\' 2"  (1.575 m)   Wt 52.6 kg (116 lb)   SpO2 96%   BMI 21.22 kg/m   Physical Exam  Constitutional: She is oriented to person, place, and time. She appears well-developed and well-nourished.  HENT:  Head: Normocephalic and atraumatic.  Cardiovascular: Normal rate and regular rhythm.  Faint SEM  Pulmonary/Chest: Effort normal and breath sounds normal. No respiratory distress.  Abdominal: Soft. There is no tenderness. There is no rebound and no guarding.  Musculoskeletal: She exhibits no edema or tenderness.  2+ radial pulses bilaterally, 2+ DP pulses bilaterally  Neurological: She is alert and oriented to person, place, and time.  Skin: Skin is warm and dry.  Psychiatric: She has a normal mood and affect. Her behavior is normal.  Nursing note and vitals reviewed.    ED Treatments / Results  Labs (all labs ordered are listed, but only abnormal results are displayed) Labs Reviewed  COMPREHENSIVE METABOLIC PANEL - Abnormal; Notable for the following components:      Result Value   Potassium 3.2 (*)    Total Protein 6.2 (*)    All other components within normal limits  TROPONIN I  CBC WITH DIFFERENTIAL/PLATELET  TSH  T4, FREE  TROPONIN I  TROPONIN I  TROPONIN I  HEMOGLOBIN A1C    EKG EKG  Interpretation  Date/Time:  Thursday March 09 2018 09:25:27 EDT Ventricular Rate:  74 PR Interval:    QRS Duration: 88 QT Interval:  384 QTC Calculation: 426 R Axis:   61 Text Interpretation:  Sinus rhythm Abnormal R-wave progression, early transition Borderline repolarization abnormality Confirmed by Quintella Reichert [phone]) on 03/09/2018 9:53:13 AM   Radiology No results found.  Procedures Procedures (including critical care time)  Medications Ordered in ED Medications  nitroGLYCERIN (NITROSTAT) SL tablet 0.4 mg (has no administration in time range)     Initial Impression / Assessment and Plan / ED Course  I have reviewed the triage vital signs and the nursing notes.  Pertinent labs & imaging results that were available during my care of the patient were reviewed by me and considered in my medical decision  making (see chart for details).     Patient here for evaluation of chest pain that began today. EKG with nonspecific changes. Her pain did improve after nitroglycerin administration. Current presentation is not consistent with dissection, PE. Cardiology consulted for further evaluation and workup. Patient updated or findings of studies and recommendation for admission and she is in agreement with plan.  Final Clinical Impressions(s) / ED Diagnoses   Final diagnoses:  None    ED Discharge Orders    None       Quintella Reichert, MD 03/09/18 1505

## 2018-03-10 ENCOUNTER — Other Ambulatory Visit: Payer: Self-pay | Admitting: Cardiology

## 2018-03-10 ENCOUNTER — Encounter (HOSPITAL_COMMUNITY): Payer: Self-pay | Admitting: Cardiology

## 2018-03-10 ENCOUNTER — Observation Stay (HOSPITAL_BASED_OUTPATIENT_CLINIC_OR_DEPARTMENT_OTHER): Payer: 59

## 2018-03-10 DIAGNOSIS — R55 Syncope and collapse: Secondary | ICD-10-CM | POA: Diagnosis not present

## 2018-03-10 DIAGNOSIS — I2510 Atherosclerotic heart disease of native coronary artery without angina pectoris: Secondary | ICD-10-CM | POA: Diagnosis not present

## 2018-03-10 DIAGNOSIS — I5030 Unspecified diastolic (congestive) heart failure: Secondary | ICD-10-CM | POA: Diagnosis not present

## 2018-03-10 DIAGNOSIS — I359 Nonrheumatic aortic valve disorder, unspecified: Secondary | ICD-10-CM

## 2018-03-10 DIAGNOSIS — Q231 Congenital insufficiency of aortic valve: Secondary | ICD-10-CM | POA: Diagnosis not present

## 2018-03-10 DIAGNOSIS — R0789 Other chest pain: Secondary | ICD-10-CM | POA: Diagnosis not present

## 2018-03-10 DIAGNOSIS — I10 Essential (primary) hypertension: Secondary | ICD-10-CM | POA: Diagnosis present

## 2018-03-10 DIAGNOSIS — R079 Chest pain, unspecified: Secondary | ICD-10-CM | POA: Diagnosis not present

## 2018-03-10 HISTORY — DX: Nonrheumatic aortic valve disorder, unspecified: I35.9

## 2018-03-10 LAB — BASIC METABOLIC PANEL
Anion gap: 6 (ref 5–15)
BUN: 10 mg/dL (ref 6–20)
CALCIUM: 8.4 mg/dL — AB (ref 8.9–10.3)
CO2: 26 mmol/L (ref 22–32)
CREATININE: 0.79 mg/dL (ref 0.44–1.00)
Chloride: 110 mmol/L (ref 98–111)
GFR calc non Af Amer: >60 mL/min (ref >60)
Glucose, Bld: 92 mg/dL (ref 70–99)
Potassium: 4 mmol/L (ref 3.5–5.1)
SODIUM: 142 mmol/L (ref 135–145)

## 2018-03-10 LAB — ECHOCARDIOGRAM COMPLETE
Height: 62 in
Weight: 1856 oz

## 2018-03-10 LAB — LIPID PANEL
CHOL/HDL RATIO: 2.8 ratio
Cholesterol: 81 mg/dL (ref 0–200)
HDL: 29 mg/dL — AB (ref >40)
LDL CALC: 37 mg/dL (ref 0–99)
TRIGLYCERIDES: 77 mg/dL (ref <150)
VLDL: 15 mg/dL (ref 0–40)

## 2018-03-10 LAB — CBC
HCT: 35.6 % — ABNORMAL LOW (ref 36.0–46.0)
Hemoglobin: 11.5 g/dL — ABNORMAL LOW (ref 12.0–15.0)
MCH: 29.2 pg (ref 26.0–34.0)
MCHC: 32.3 g/dL (ref 30.0–36.0)
MCV: 90.4 fL (ref 78.0–100.0)
PLATELETS: 204 10*3/uL (ref 150–400)
RBC: 3.94 MIL/uL (ref 3.87–5.11)
RDW: 13 % (ref 11.5–15.5)
WBC: 7.1 10*3/uL (ref 4.0–10.5)

## 2018-03-10 LAB — HIV ANTIBODY (ROUTINE TESTING W REFLEX): HIV SCREEN 4TH GENERATION: NONREACTIVE

## 2018-03-10 LAB — TROPONIN I: Troponin I: <0.03 ng/mL (ref <0.03)

## 2018-03-10 MED ORDER — BENAZEPRIL HCL 20 MG PO TABS: 20.0000 mg | ORAL_TABLET | Freq: Every day | ORAL | 6 refills | Status: DC

## 2018-03-10 MED ORDER — BENAZEPRIL HCL 20 MG PO TABS
20.0000 mg | ORAL_TABLET | Freq: Every day | ORAL | Status: DC
  Administered 2018-03-10: 20 mg via ORAL
  Filled 2018-03-10: qty 1

## 2018-03-10 NOTE — Progress Notes (Addendum)
Progress Note  Patient Name: Erin [name] Date of Encounter: 03/10/2018  Primary Cardiologist: Mertie Moores, MD   Subjective   Feels better, BP was 70 SBP post syncope at home. [employer], [employer]) noted low BP once or twice here. No CP, no SOB.   Inpatient Medications    Scheduled Meds: . aspirin  324 mg Oral NOW   Or  . aspirin  300 mg Rectal NOW  . aspirin EC  81 mg Oral Daily  . benazepril  20 mg Oral Daily  . ezetimibe  10 mg Oral Daily  . fluticasone  1 spray Each Nare Daily  . heparin  5,000 Units Subcutaneous Q8H  . rosuvastatin  20 mg Oral Daily   Continuous Infusions: . sodium chloride 50 mL/hr at 03/10/18 0800   PRN Meds: acetaminophen, iopamidol, metoprolol tartrate, nitroGLYCERIN, ondansetron (ZOFRAN) IV   Vital Signs    Vitals:   03/09/18 1911 03/10/18 0003 03/10/18 0308 03/10/18 0751  BP: 106/62 106/70 119/75 127/73  Pulse: 71 68 73 (!) 59  Resp: 19 19 17  (!) 21  Temp: 98 F (36.7 C) 98 F (36.7 C) 98.4 F (36.9 C) 97.8 F (36.6 C)  TempSrc: Oral Oral Oral Oral  SpO2: 97% 98% 98% 97%  Weight:      Height:        Intake/Output Summary (Last 24 hours) at 03/10/2018 0934 Last data filed at 03/10/2018 0900 Gross per 24 hour  Intake 1446.73 ml  Output 300 ml  Net 1146.73 ml   Filed Weights   03/09/18 0937  Weight: 116 lb (52.6 kg)    Telemetry    No adverse rhythms - Personally Reviewed  ECG    SB 52, normal intervals - Personally Reviewed  Physical Exam   GEN: No acute distress.  Thin Neck: No JVD Cardiac: RRR, no murmurs, rubs, or gallops.  Respiratory: Clear to auscultation bilaterally. GI: Soft, nontender, non-distended  MS: No edema; No deformity. Neuro:  Nonfocal  Psych: Normal affect   Labs    Chemistry Recent Labs  Lab 03/09/18 1036 03/10/18 0240  NA 141 142  K 3.2* 4.0  CL 108 110  CO2 24 26  GLUCOSE 98 92  BUN 17 10  CREATININE 0.86 0.79  CALCIUM 8.9 8.4*  PROT 6.2*  --   ALBUMIN 3.7  --   AST 16  --   ALT  13  --   ALKPHOS 76  --   BILITOT 0.6  --   GFRNONAA >60 >60  GFRAA >60 >60  ANIONGAP 9 6     Hematology Recent Labs  Lab 03/09/18 1036 03/10/18 0240  WBC 10.0 7.1  RBC 4.48 3.94  HGB 13.3 11.5*  HCT 39.8 35.6*  MCV 88.8 90.4  MCH 29.7 29.2  MCHC 33.4 32.3  RDW 13.1 13.0  PLT 230 204    Cardiac Enzymes Recent Labs  Lab 03/09/18 1036 03/09/18 1619 03/09/18 1955 03/10/18 0240  TROPONINI <0.03 <0.03 <0.03 <0.03   No results for input(s): TROPIPOC in the last 168 hours.   BNPNo results for input(s): BNP, PROBNP in the last 168 hours.   DDimer No results for input(s): DDIMER in the last 168 hours.   Radiology    Dg Chest 2 View  Result Date: 03/09/2018 CLINICAL DATA:  Chest pain EXAM: CHEST - 2 VIEW COMPARISON:  April 28, 2004 FINDINGS: There is no edema or consolidation. The heart size and pulmonary vascularity are normal. No adenopathy. There is aortic atherosclerosis.  There is midthoracic dextroscoliosis with thoracolumbar levoscoliosis. IMPRESSION: No edema or consolidation. Persistent scoliosis. There is aortic atherosclerosis. Aortic Atherosclerosis (ICD10-I70.0). Electronically Signed   By: Lowella Grip III M.D.   On: 03/09/2018 10:54   Ct Coronary Morph W/cta Cor W/score W/ca W/cm &/or Wo/cm  Addendum Date: 03/09/2018   ADDENDUM REPORT: 03/09/2018 17:18 CLINICAL DATA:  Chest pain EXAM: Cardiac CTA MEDICATIONS: Sub lingual nitro.  4mg  x 2 and lopressor 5mg  IV TECHNIQUE: The patient was scanned on a Siemens 962 slice scanner. Gantry rotation speed was 250 msecs. Collimation was 0.8 mm. A 100 kV prospective scan was triggered in the ascending thoracic aorta at 35-75% of the R-R interval. Average HR during the scan was 60 bpm. The 3D data set was interpreted on a dedicated work station using MPR, MIP and VRT modes. A total of 80cc of contrast was used. FINDINGS: Non-cardiac: See separate report from [facility]. There was significant misregistration  artifact but this did not significantly impede the ability to read the study. Calcium Score: 22 Agatston units. Coronary Arteries: Right dominant with no anomalies LM: No plaque or stenosis. LAD system: Smalll area of calcified plaque proximal LAD, no significant stenosis. Circumflex system: No plaque or stenosis. RCA system: No plaque or stenosis. IMPRESSION: 1. Coronary artery calcium score 22 Agatston units, this places the patient in the 79th percentile for age and gender. This suggests moderate to high risk for future cardiac events. 2.  No obstructive coronary disease noted. Dalton Mclean Electronically Signed   By: Loralie Champagne M.D.   On: 03/09/2018 17:18   Result Date: 03/09/2018 EXAM: OVER-READ INTERPRETATION  CT CHEST The following report is an over-read performed by radiologist Dr. Suzy Bouchard of [facility], [place] on 03/09/2018. This over-read does not include interpretation of cardiac or coronary anatomy or pathology. The coronary calcium score/coronary CTA interpretation by the cardiologist is attached. COMPARISON:  None. FINDINGS: Limited view of the lung parenchyma demonstrates no suspicious nodularity. Airways are normal. Limited view of the mediastinum demonstrates no adenopathy. Esophagus normal. Limited view of the upper abdomen unremarkable. Limited view of the skeleton and chest wall is unremarkable. IMPRESSION: No significant extracardiac findings. Electronically Signed: By: Suzy Bouchard M.D. On: 03/09/2018 16:14    Cardiac Studies   CT of cors LAD system: Smalll area of calcified plaque proximal LAD, no significant stenosis.  1. Coronary artery calcium score 22 Agatston units, this places the patient in the 79th percentile for age and gender. This suggests moderate to high risk for future cardiac events.  2.  No obstructive coronary disease noted.  Dalton Mclean   Electronically Signed   By: Loralie Champagne M.D.  Patient Profile     62 y.o. female  with mild LAD lesion, no obstruction, here post syncope, CP, left arm parasthesia  Assessment & Plan    CP  - reassuring CT with no obstructive CAD. Mild LAD calcified plaque.   - secondary prevention.On Crestor and Zetia,  statin use to help reduce risks. (Prior allergy listed with Lipitor). LDL 37  - Trop normal  Bicuspid AV  - follow clinically  - no aortic dissection on CT  - ECHO prelim - normal EF, AV appears trileaflet but difficult to visualize, no gradient, mild TR. Normal RV. Normal Ao.   Syncope  - likely vagal in heat. Nothing on tele adverse  - plan on outpatient 30 day event monitor  - pulling back on BP meds. Stopping amlodipine 2.5. Decreased ACE to 20.  Have follow up in 6 weeks (post monitor) with Dr. Acie Fredrickson.  Check BP at home. OK to work. Driving restrictions discussed. However, clinically vagal etiology.    For questions or updates, please contact [place] Please consult [email or web address] for contact info under Cardiology/STEMI.      Signed, Candee Furbish, MD  03/10/2018, 9:34 AM

## 2018-03-10 NOTE — Progress Notes (Signed)
  Echocardiogram 2D Echocardiogram has been performed.  Erin [name] L Androw 03/10/2018, 9:00 AM

## 2018-03-10 NOTE — Discharge Instructions (Signed)
Call if any further problems  If you still feel congested you could take the [place], but otherwise ok to hold  Follow up with your PCP next week.    Heart Healthy diet.
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
## 2018-03-15 ENCOUNTER — Ambulatory Visit (INDEPENDENT_AMBULATORY_CARE_PROVIDER_SITE_OTHER): Payer: 59

## 2018-03-15 DIAGNOSIS — R55 Syncope and collapse: Secondary | ICD-10-CM

## 2018-03-30 ENCOUNTER — Encounter: Payer: Self-pay | Admitting: Cardiovascular Disease

## 2018-04-21 ENCOUNTER — Ambulatory Visit (INDEPENDENT_AMBULATORY_CARE_PROVIDER_SITE_OTHER): Payer: 59 | Admitting: Cardiovascular Disease

## 2018-04-21 ENCOUNTER — Encounter (INDEPENDENT_AMBULATORY_CARE_PROVIDER_SITE_OTHER): Payer: Self-pay

## 2018-04-21 ENCOUNTER — Encounter: Payer: Self-pay | Admitting: Cardiovascular Disease

## 2018-04-21 VITALS — BP 146/94 | HR 75 | Ht 62.0 in | Wt 120.4 lb

## 2018-04-21 DIAGNOSIS — R55 Syncope and collapse: Secondary | ICD-10-CM

## 2018-04-21 DIAGNOSIS — I701 Atherosclerosis of renal artery: Secondary | ICD-10-CM

## 2018-04-21 NOTE — Patient Instructions (Addendum)
Medication Instructions:  Your physician recommends that you continue on your current medications as directed. Please refer to the Current Medication list given to you today.   Labwork: None Ordered   Testing/Procedures: Your physician has requested that you have a renal artery duplex in 6 months before your office visit with Dr. Acie Fredrickson. During this test, an ultrasound is used to evaluate blood flow to the kidneys. Allow one hour for this exam. Do not eat after midnight the day before and avoid carbonated beverages. Take your medications as you usually do.    Follow-Up: Your physician wants you to follow-up in: 6 months with Dr. Acie Fredrickson. You will receive a reminder letter in the mail two months in advance. If you don't receive a letter, please call our office to schedule the follow-up appointment.   If you need a refill on your cardiac medications before your next appointment, please call your pharmacy.   Thank you for choosing [facility]! Christen Bame, RN [phone]

## 2018-04-21 NOTE — Progress Notes (Signed)
Shakia Garald Balding Date of Birth  [birth year] [facility] [address]    [place], [place]  [address] [phone]  Fax  [phone]  Problem list 1. Hyperlipidemia 2. Functional bicuspid aortic valve  Erin [name] is a 4 with a hx of hyperlipidemia and a functional bicuspid aortic valve.  She has done well. Denies any chest pain or dyspnea.    Nov. 21, 2014:  Yamilet is ding ok.  She is taking Crestor and not the mevacor as listed in med list.  No CP,  Anxious about her BP;.  She avoids salt .  BP is typicall 140s / 80's.  Nov. 24, 2015:  BP is a bit high.   Not eating any extra salt.   Has been taking some cold meds recently.  Has been exercising twice a week.   Boot camp.  No CP or dyspnea.   Nov. 30,  2016: Doing great. Boot camp twice a week .  Gets her labs at Dr. Silvestre Mesi office.   Nov. 28, 2017:  Doing well Still exercising .  No CP , no dyspnea.    Sept. 6, 2019:  Has been under lots of stress since the death of her husband , Mortimer Fries. Had a syncopal episode on July 24.      She was walking down the back steps.   Echocardiogram reveals normal left ventricular systolic function with EF of 55 to 60%.  She has a very mild aortic stenosis with a mean gradient of 6 mmHg.  The aortic valve was thought to be trileaflet.  Coronary CT angiogram revealed essentially normal coronary arteries.  Her coronary calcium score was 22 Agatson units    She has not been eating very well since probably passed away.  She is lost about 10 pounds.  Since her syncopal episode she has been eating a little bit better.  Family around.  She has not gotten any formal counseling.  I advised her that she may need some additional counseling.   Current Outpatient Medications  Medication Sig Dispense Refill  . aspirin 81 MG tablet Take 81 mg by mouth once a week. Once a week    . benazepril (LOTENSIN) 20 MG tablet Take 1 tablet (20 mg total) by mouth daily. 30 tablet 6  . ezetimibe (ZETIA) 10  MG tablet Take 10 mg by mouth daily.    . fluticasone (FLONASE) 50 MCG/ACT nasal spray Place 1 spray into both nostrils daily.    . rosuvastatin (CRESTOR) 20 MG tablet Take 20 mg by mouth daily.    . valACYclovir (VALTREX) 1000 MG tablet Take 1 g by mouth daily as needed. AS NEEDED FOR COLD SORES     No current facility-administered medications for this visit.     Allergies  Allergen Reactions  . Lipitor [Atorvastatin Calcium] Other (See Comments)    MUSCLE ACHES    Past Medical History:  Diagnosis Date  . Aortic valve disorder 03/10/2018  . Chest pain at rest, neg MI, no CAD on cardiac CTA 03/09/2018  . Coronary artery disease    mild, per cardiology note  . Dental crowns present   . Hyperlipidemia   . Hypertension    states under control with med., has been on med. x 12 yr.  . Tricuspid but functionally bicuspid aortic valve    congenital, per pt. - is checked yearly by cardiologist  . Trigger ring finger of left hand 10/2015    Past Surgical History:  Procedure Laterality Date  .  ABDOMINAL HYSTERECTOMY     complete  . APPENDECTOMY    . CHOLECYSTECTOMY    . FOOT GANGLION EXCISION Left 04/09/2004  . INCISIONAL HERNIA REPAIR Right 04/28/2004   RUQ ventral  . MINOR IRRIGATION AND DEBRIDEMENT OF WOUND Left 07/13/2006   elbow  . OLECRANON BURSA EXCISION Left 02/02/2006  . ROTATOR CUFF REPAIR Bilateral   . TRIGGER FINGER RELEASE Left 11/13/2015   Procedure: LEFT RING FINGER RELEASE TRIGGER FINGER/A-1 PULLEY;  Surgeon: Ninetta Lights, MD;  Location: [facility];  Service: Orthopedics;  Laterality: Left;  . TRIGGER FINGER RELEASE Right 09/16/2016   Procedure: RIGHT TRIGGER FINGER RELEASE RING FINGER AND SMALL FINGER;  Surgeon: Ninetta Lights, MD;  Location: [place];  Service: Orthopedics;  Laterality: Right;  RIGHT TRIGGER FINGER RELEASE RING FINGER AND SMALL FINGER    Social History   Tobacco Use  Smoking Status Former Smoker  . Last attempt to  quit: 06/11/2010  . Years since quitting: 7.8  Smokeless Tobacco Never Used    Social History   Substance and Sexual Activity  Alcohol Use No    Family History  Problem Relation Age of Onset  . Aneurysm Mother   . Cancer Father   . Heart attack Maternal Grandfather   . Stroke Sister   . Heart attack Brother 42       died    70 of Systems:  Reviewed in the HPI.  All other systems are negative.   Physical Exam: Blood pressure (!) 146/94, pulse 75, height 5\' 2"  (1.575 m), weight 120 lb 6.4 oz (54.6 kg), SpO2 98 %.  GEN:  Well nourished, well developed in no acute distress HEENT: Normal NECK: No JVD; soft systlic bruits.  LYMPHATICS: No lymphadenopathy CARDIAC: RRR. Soft systolic murmur   RESPIRATORY:  Clear to auscultation without rales, wheezing or rhonchi  ABDOMEN: Soft, non-tender, non-distended MUSCULOSKELETAL:  No edema; No deformity  SKIN: Warm and dry NEUROLOGIC:  Alert and oriented x 3   ECG:    Assessment / Plan:   1.   Syncope: To be presents today for follow-up of her syncopal episode.  She had a syncopal episode while walking up the back steps.  She was admitted to the hospital.  Echocardiogram revealed normal left ventricular systolic function.  She has very mild aortic stenosis with a mean aortic valve gradient of 6 mmHg. Coronary CT angiogram reveals normal coronary arteries.  Her coronary calcium score is 22.    1 . Carotid artery disease:  Has mild carotid disease    .  2. Hyperlipidemia.     Following    3. Abdominal bruit.   The renal artery duplex scan in 2017 showed moderate renal artery disease but the follow-up duplex scan in 2018 did not show any significant disease.  We will get another study.  If her renal arteries appear to be stable then we will do them as needed.     Will see her in 6 months     Mertie Moores, MD  04/21/2018 10:37 AM    [address] [place],  [place], [place]   [id number] Pager [phone] Phone: [phone]; Fax: [phone]

## 2018-05-09 DIAGNOSIS — Z23 Encounter for immunization: Secondary | ICD-10-CM | POA: Diagnosis not present

## 2018-06-15 DIAGNOSIS — Z0000 Encounter for general adult medical examination without abnormal findings: Secondary | ICD-10-CM | POA: Diagnosis not present

## 2018-06-15 DIAGNOSIS — M859 Disorder of bone density and structure, unspecified: Secondary | ICD-10-CM | POA: Diagnosis not present

## 2018-06-15 DIAGNOSIS — R82998 Other abnormal findings in urine: Secondary | ICD-10-CM | POA: Diagnosis not present

## 2018-06-22 DIAGNOSIS — Z0000 Encounter for general adult medical examination without abnormal findings: Secondary | ICD-10-CM | POA: Diagnosis not present

## 2018-06-22 DIAGNOSIS — Z1389 Encounter for screening for other disorder: Secondary | ICD-10-CM | POA: Diagnosis not present

## 2018-06-22 DIAGNOSIS — I6529 Occlusion and stenosis of unspecified carotid artery: Secondary | ICD-10-CM | POA: Diagnosis not present

## 2018-06-22 DIAGNOSIS — I7389 Other specified peripheral vascular diseases: Secondary | ICD-10-CM | POA: Diagnosis not present

## 2018-06-28 DIAGNOSIS — D225 Melanocytic nevi of trunk: Secondary | ICD-10-CM | POA: Diagnosis not present

## 2018-06-28 DIAGNOSIS — Z85828 Personal history of other malignant neoplasm of skin: Secondary | ICD-10-CM | POA: Diagnosis not present

## 2018-06-28 DIAGNOSIS — L820 Inflamed seborrheic keratosis: Secondary | ICD-10-CM | POA: Diagnosis not present

## 2018-06-28 DIAGNOSIS — L570 Actinic keratosis: Secondary | ICD-10-CM | POA: Diagnosis not present

## 2018-06-28 DIAGNOSIS — L821 Other seborrheic keratosis: Secondary | ICD-10-CM | POA: Diagnosis not present

## 2018-06-30 DIAGNOSIS — Z1212 Encounter for screening for malignant neoplasm of rectum: Secondary | ICD-10-CM | POA: Diagnosis not present

## 2018-07-21 ENCOUNTER — Ambulatory Visit (HOSPITAL_COMMUNITY)
Admission: RE | Admit: 2018-07-21 | Discharge: 2018-07-21 | Disposition: A | Discharge status: Home / Self Care | Payer: 59 | Source: Ambulatory Visit | Attending: Cardiovascular Disease | Admitting: Cardiovascular Disease

## 2018-07-21 DIAGNOSIS — I701 Atherosclerosis of renal artery: Secondary | ICD-10-CM

## 2018-11-25 IMAGING — DX DG CHEST 2V
2 series · 2 of 2 positions shown · non-contrast
Comparison: April 28, 2004

CLINICAL DATA: Chest pain

EXAM:
CHEST - 2 VIEW

[chest pa]
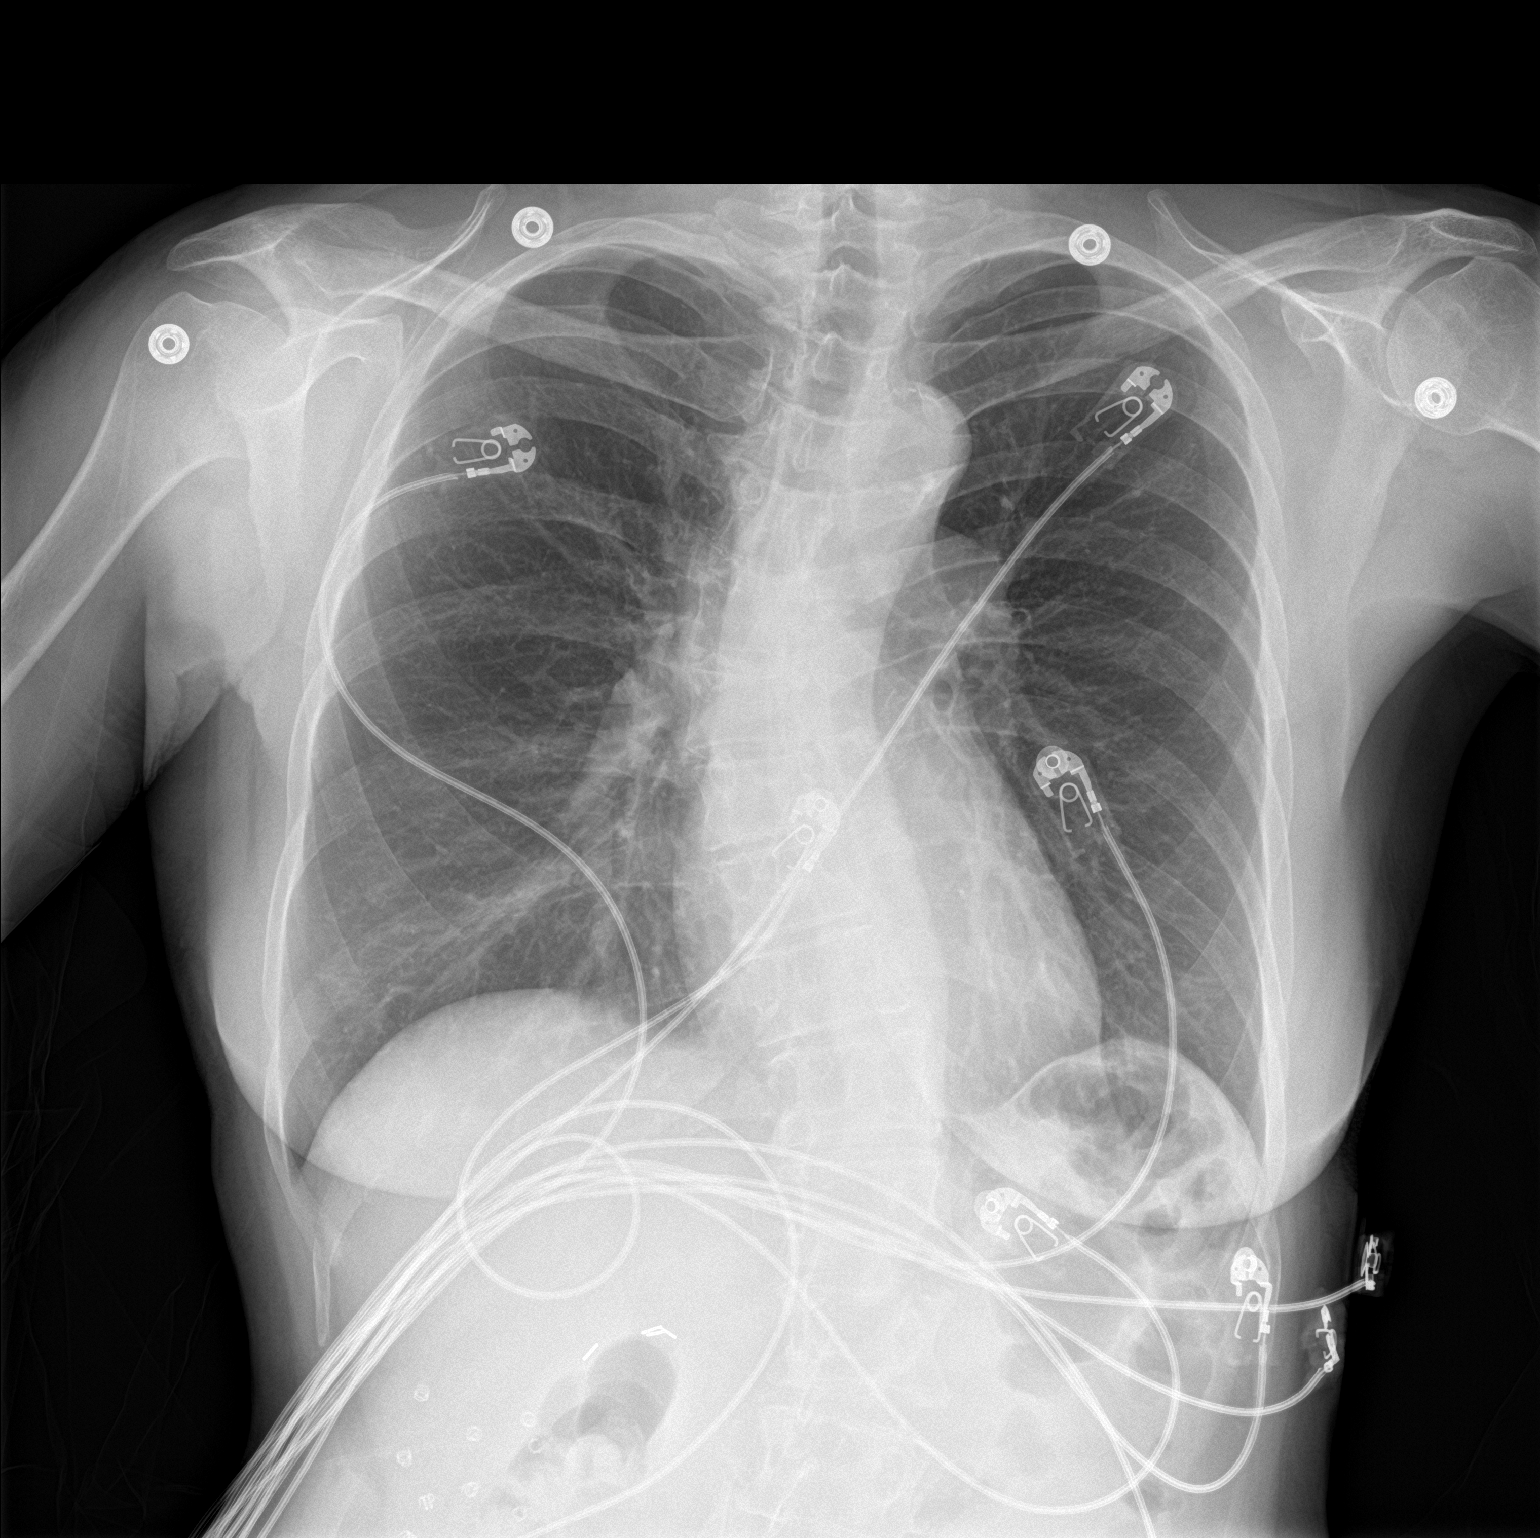

[chest lat]
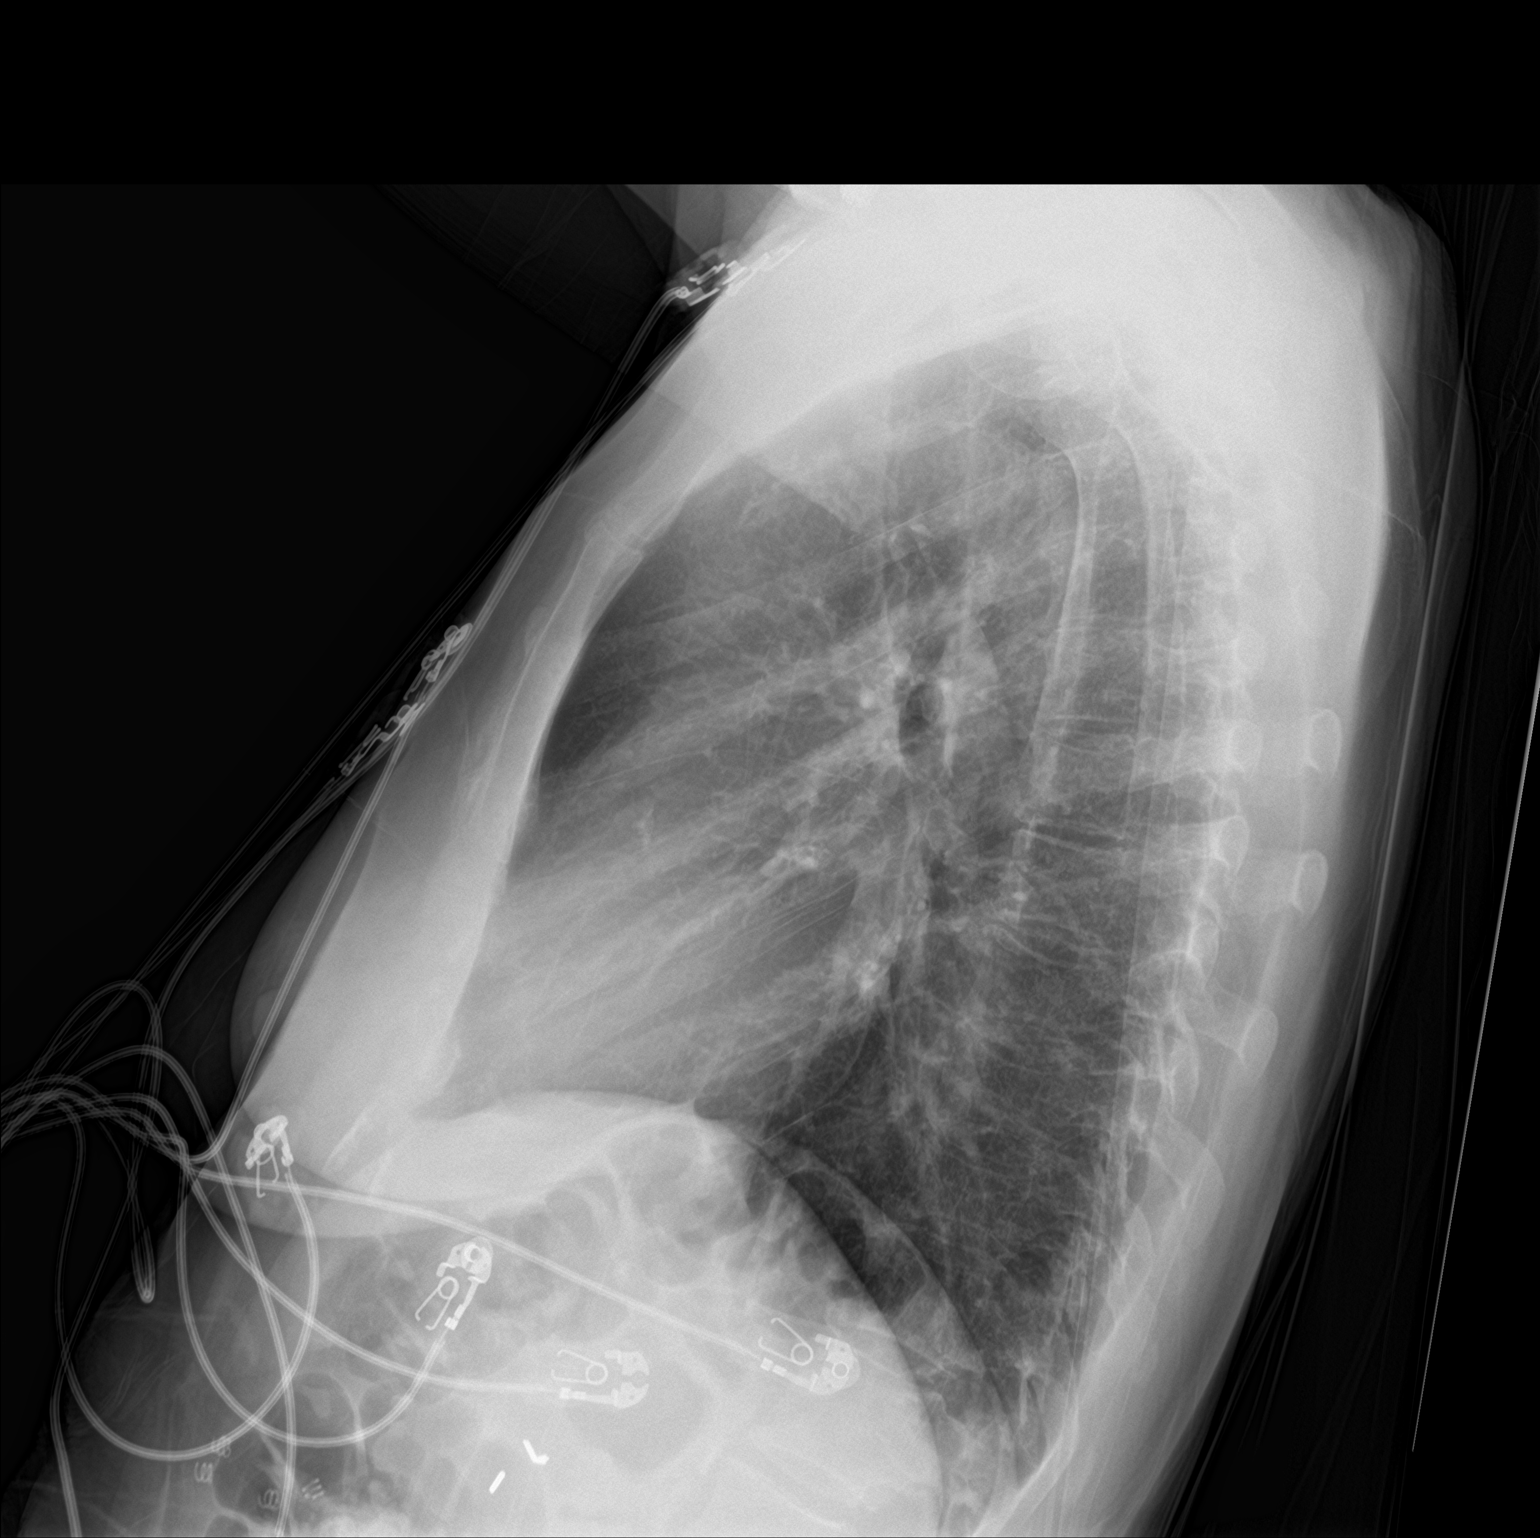

[2 of 2 positions shown; findings below may reference images not displayed]

FINDINGS: There is no edema or consolidation. The heart size and pulmonary
vascularity are normal. No adenopathy. There is aortic
atherosclerosis. There is midthoracic dextroscoliosis with
thoracolumbar levoscoliosis.
IMPRESSION: No edema or consolidation. Persistent scoliosis. There is aortic
atherosclerosis.

Aortic Atherosclerosis ([id number]-MV8.8).

## 2018-12-13 ENCOUNTER — Telehealth: Payer: Self-pay | Admitting: Cardiovascular Disease

## 2018-12-13 NOTE — Telephone Encounter (Signed)
Spoke with patient who confirmed all demographics. Patient  Has smart phone. Will have vitals ready for visit. She was able to sign in to My Chart.

## 2019-01-04 ENCOUNTER — Telehealth: Payer: Self-pay | Admitting: Nurse Practitioner

## 2019-01-04 NOTE — Telephone Encounter (Signed)
Left message for patient to call back. I asked her to move her appointment to earlier on 5/28 or to the morning of 5/27

## 2019-01-05 NOTE — Telephone Encounter (Signed)
Follow up    FYI! Patient returned call and changed appt to 5/27 at 9:40am.

## 2019-01-05 NOTE — Telephone Encounter (Signed)
Spoke with patient in regards to her appointment. She is aware that she will receive a call 15 minutes prior to her appointment to go over meds and obtain vital signs. I explained the doxy.me process and she states her understanding. She also agrees to read the mychart consent that was sent to her mychart on 12/13/18.

## 2019-01-10 ENCOUNTER — Other Ambulatory Visit: Payer: Self-pay

## 2019-01-10 ENCOUNTER — Telehealth (INDEPENDENT_AMBULATORY_CARE_PROVIDER_SITE_OTHER): Payer: BLUE CROSS/BLUE SHIELD | Admitting: Cardiovascular Disease

## 2019-01-10 ENCOUNTER — Encounter: Payer: Self-pay | Admitting: Cardiovascular Disease

## 2019-01-10 VITALS — BP 126/84 | HR 74 | Ht 63.0 in | Wt 125.0 lb

## 2019-01-10 DIAGNOSIS — E782 Mixed hyperlipidemia: Secondary | ICD-10-CM | POA: Diagnosis not present

## 2019-01-10 DIAGNOSIS — Z7189 Other specified counseling: Secondary | ICD-10-CM

## 2019-01-10 DIAGNOSIS — I350 Nonrheumatic aortic (valve) stenosis: Secondary | ICD-10-CM

## 2019-01-10 NOTE — Progress Notes (Signed)
Virtual Visit via Video Note   This visit type was conducted due to national recommendations for restrictions regarding the COVID-19 Pandemic (e.g. social distancing) in an effort to limit this patient's exposure and mitigate transmission in our community.  Due to her co-morbid illnesses, this patient is at least at moderate risk for complications without adequate follow up.  This format is felt to be most appropriate for this patient at this time.  All issues noted in this document were discussed and addressed.  A limited physical exam was performed with this format.  Please refer to the patient's chart for her consent to telehealth for [facility].   Date:  01/10/2019   ID:  Erin [name], DOB [birth year], MRN [id number]  Patient Location: Home Provider Location: Home  PCP:  Crist Infante, MD  Cardiologist:  Mertie Moores, MD  Electrophysiologist:  None   Problem list 1. Hyperlipidemia 2. Functional bicuspid aortic valve 3. Coronary Artery calcifications  Erin [name] is a 82 with a hx of hyperlipidemia and a functional bicuspid aortic valve.  She has done well. Denies any chest pain or dyspnea.    Nov. 21, 2014:  Erin [name] is ding ok.  She is taking Crestor and not the mevacor as listed in med list.  No CP,  Anxious about her BP;.  She avoids salt .  BP is typicall 140s / 80's.  Nov. 24, 2015:  BP is a bit high.   Not eating any extra salt.   Has been taking some cold meds recently.  Has been exercising twice a week.   Boot camp.  No CP or dyspnea.   Nov. 30,  2016: Doing great. Boot camp twice a week .  Gets her labs at Dr. Silvestre Mesi office.   Nov. 28, 2017:  Doing well Still exercising .  No CP , no dyspnea.    Sept. 6, 2019:  Has been under lots of stress since the death of her husband , Erin [name]. Had a syncopal episode on July 24.      She was walking down the back steps.   Echocardiogram reveals normal left ventricular systolic function with EF of 55 to 60%.  She has  a very mild aortic stenosis with a mean gradient of 6 mmHg.  The aortic valve was thought to be trileaflet.  Coronary CT angiogram revealed essentially normal coronary arteries.  Her coronary calcium score was 22 Agatson units    She has not been eating very well since her husband, Erin [name]  passed away.  She is lost about 10 pounds.  Since her syncopal episode she has been eating a little bit better.  She has Family around.  She has not gotten any formal counseling.  I advised her that she may need some additional counseling.  Jan 10, 2019     Evaluation Performed:  Follow-Up Visit  Chief Complaint:  hyperlipidemia   Erin [name] is a 63 y.o. female with hyperlipidemia.  Has been eating better.   Has gained some weight.   The patient does not have symptoms concerning for COVID-19 infection (fever, chills, cough, or new shortness of breath).    Past Medical History:  Diagnosis Date  . Aortic valve disorder 03/10/2018  . Chest pain at rest, neg MI, no CAD on cardiac CTA 03/09/2018  . Coronary artery disease    mild, per cardiology note  . Dental crowns present   . Hyperlipidemia   . Hypertension    states under control with med.,  has been on med. x 12 yr.  . Tricuspid but functionally bicuspid aortic valve    congenital, per pt. - is checked yearly by cardiologist  . Trigger ring finger of left hand 10/2015   Past Surgical History:  Procedure Laterality Date  . ABDOMINAL HYSTERECTOMY     complete  . APPENDECTOMY    . CHOLECYSTECTOMY    . FOOT GANGLION EXCISION Left 04/09/2004  . INCISIONAL HERNIA REPAIR Right 04/28/2004   RUQ ventral  . MINOR IRRIGATION AND DEBRIDEMENT OF WOUND Left 07/13/2006   elbow  . OLECRANON BURSA EXCISION Left 02/02/2006  . ROTATOR CUFF REPAIR Bilateral   . TRIGGER FINGER RELEASE Left 11/13/2015   Procedure: LEFT RING FINGER RELEASE TRIGGER FINGER/A-1 PULLEY;  Surgeon: Ninetta Lights, MD;  Location: [place];  Service:  Orthopedics;  Laterality: Left;  . TRIGGER FINGER RELEASE Right 09/16/2016   Procedure: RIGHT TRIGGER FINGER RELEASE RING FINGER AND SMALL FINGER;  Surgeon: Ninetta Lights, MD;  Location: [place];  Service: Orthopedics;  Laterality: Right;  RIGHT TRIGGER FINGER RELEASE RING FINGER AND SMALL FINGER     Current Meds  Medication Sig  . aspirin 81 MG tablet Take 81 mg by mouth once a week. Once a week  . benazepril (LOTENSIN) 20 MG tablet Take 1 tablet (20 mg total) by mouth daily.  Marland Kitchen escitalopram (LEXAPRO) 10 MG tablet Take 10 mg by mouth daily.  Marland Kitchen ezetimibe (ZETIA) 10 MG tablet Take 10 mg by mouth daily.  . rosuvastatin (CRESTOR) 20 MG tablet Take 20 mg by mouth daily.  . valACYclovir (VALTREX) 1000 MG tablet Take 1 g by mouth daily as needed. AS NEEDED FOR COLD SORES     Allergies:   Lipitor [atorvastatin calcium]   Social History   Tobacco Use  . Smoking status: Former Smoker    Last attempt to quit: 06/11/2010    Years since quitting: 8.5  . Smokeless tobacco: Never Used  Substance Use Topics  . Alcohol use: No  . Drug use: No     Family Hx: The patient's family history includes Aneurysm in her mother; Cancer in her father; Heart attack in her maternal grandfather; Heart attack (age of onset: 30) in her brother; Stroke in her sister.  ROS:   Please see the history of present illness.     All other systems reviewed and are negative.   Prior CV studies:   The following studies were reviewed today:    Labs/Other Tests and Data Reviewed:    EKG:  No ECG reviewed.  Recent Labs: 03/09/2018: ALT 13; TSH 0.283 03/10/2018: BUN 10; Creatinine, Ser 0.79; Hemoglobin 11.5; Platelets 204; Potassium 4.0; Sodium 142   Recent Lipid Panel Lab Results  Component Value Date/Time   CHOL 81 03/10/2018 02:40 AM   TRIG 77 03/10/2018 02:40 AM   HDL 29 (L) 03/10/2018 02:40 AM   CHOLHDL 2.8 03/10/2018 02:40 AM   LDLCALC 37 03/10/2018 02:40 AM    Wt Readings from Last  3 Encounters:  01/10/19 125 lb (56.7 kg)  04/21/18 120 lb 6.4 oz (54.6 kg)  03/09/18 116 lb (52.6 kg)     Objective:    Vital Signs:  BP 126/84 (BP Location: Right Arm, Patient Position: Sitting, Cuff Size: Normal)   Pulse 74   Ht 5\' 3"  (1.6 m)   Wt 125 lb (56.7 kg)   BMI 22.14 kg/m    VITAL SIGNS:  reviewed GEN:  no acute distress EYES:  sclerae anicteric, EOMI - Extraocular Movements Intact RESPIRATORY:  normal respiratory effort, symmetric expansion CARDIOVASCULAR:  no peripheral edema SKIN:  no rash, lesions or ulcers. MUSCULOSKELETAL:  no obvious deformities. NEURO:  alert and oriented x 3, no obvious focal deficit PSYCH:  normal affect  ASSESSMENT & PLAN:    1. Aortic stenosis: Darnell has very mild aortic stenosis.  Her valve appears to be a 3 leaflet valve but it appears to be a functional bicuspid valve.  Her symptoms have not worsened.  She is not having any chest pain or shortness of breath.  I have advised her to exercise on a regular basis.  2.  Hyperlipidemia: Continue current medications.   She had labs drawn by Dr. Joylene Draft recently.  I will see her in 1 year and will review labs and medications at that time.  COVID-19 Education: The signs and symptoms of COVID-19 were discussed with the patient and how to seek care for testing (follow up with PCP or arrange E-visit).  The importance of social distancing was discussed today.  Time:   Today, I have spent  18 minutes with the patient with telehealth technology discussing the above problems.     Medication Adjustments/Labs and Tests Ordered: Current medicines are reviewed at length with the patient today.  Concerns regarding medicines are outlined above.   Tests Ordered: No orders of the defined types were placed in this encounter.   Medication Changes: No orders of the defined types were placed in this encounter.   Disposition:  Follow up in 1 year(s)  Signed, Mertie Moores, MD  01/10/2019 9:58 AM    [facility]

## 2019-01-10 NOTE — Patient Instructions (Signed)
Medication Instructions:  No changes If you need a refill on your cardiac medications before your next appointment, please call your pharmacy.   Lab work: none If you have labs (blood work) drawn today and your tests are completely normal, you will receive your results only by: Marland Kitchen MyChart Message (if you have MyChart) OR . A paper copy in the mail If you have any lab test that is abnormal or we need to change your treatment, we will call you to review the results.  Testing/Procedures: none  Follow-Up: At [facility], you and your health needs are our priority.  As part of our continuing mission to provide you with exceptional heart care, we have created designated Provider Care Teams.  These Care Teams include your primary Cardiologist (physician) and Advanced Practice Providers (APPs -  Physician Assistants and Nurse Practitioners) who all work together to provide you with the care you need, when you need it. You will need a follow up appointment in:  12 months.  Please call our office 2 months in advance to schedule this appointment.  You may see Mertie Moores, MD or one of the following Advanced Practice Providers on your designated Care Team: Richardson Dopp, PA-C [place], [place] . Daune Perch, NP  Any Other Special Instructions Will Be Listed Below (If Applicable).

## 2019-01-11 ENCOUNTER — Telehealth: Payer: BLUE CROSS/BLUE SHIELD | Admitting: Cardiovascular Disease

## 2019-01-11 DIAGNOSIS — D485 Neoplasm of uncertain behavior of skin: Secondary | ICD-10-CM | POA: Diagnosis not present

## 2019-01-11 DIAGNOSIS — L439 Lichen planus, unspecified: Secondary | ICD-10-CM | POA: Diagnosis not present

## 2019-06-04 DIAGNOSIS — Z1231 Encounter for screening mammogram for malignant neoplasm of breast: Secondary | ICD-10-CM | POA: Diagnosis not present

## 2019-06-25 DIAGNOSIS — M25512 Pain in left shoulder: Secondary | ICD-10-CM | POA: Diagnosis not present

## 2019-06-25 DIAGNOSIS — M542 Cervicalgia: Secondary | ICD-10-CM | POA: Diagnosis not present

## 2019-06-28 DIAGNOSIS — D225 Melanocytic nevi of trunk: Secondary | ICD-10-CM | POA: Diagnosis not present

## 2019-06-28 DIAGNOSIS — Z85828 Personal history of other malignant neoplasm of skin: Secondary | ICD-10-CM | POA: Diagnosis not present

## 2019-06-28 DIAGNOSIS — L820 Inflamed seborrheic keratosis: Secondary | ICD-10-CM | POA: Diagnosis not present

## 2019-06-28 DIAGNOSIS — L821 Other seborrheic keratosis: Secondary | ICD-10-CM | POA: Diagnosis not present

## 2019-06-28 DIAGNOSIS — L814 Other melanin hyperpigmentation: Secondary | ICD-10-CM | POA: Diagnosis not present

## 2019-06-29 ENCOUNTER — Encounter: Payer: Self-pay | Admitting: Internal Medicine

## 2019-07-31 DIAGNOSIS — R7301 Impaired fasting glucose: Secondary | ICD-10-CM | POA: Diagnosis not present

## 2019-07-31 DIAGNOSIS — E7849 Other hyperlipidemia: Secondary | ICD-10-CM | POA: Diagnosis not present

## 2019-07-31 DIAGNOSIS — M859 Disorder of bone density and structure, unspecified: Secondary | ICD-10-CM | POA: Diagnosis not present

## 2019-08-02 DIAGNOSIS — I10 Essential (primary) hypertension: Secondary | ICD-10-CM | POA: Diagnosis not present

## 2019-08-02 DIAGNOSIS — R82998 Other abnormal findings in urine: Secondary | ICD-10-CM | POA: Diagnosis not present

## 2019-08-07 DIAGNOSIS — R7301 Impaired fasting glucose: Secondary | ICD-10-CM | POA: Diagnosis not present

## 2019-08-07 DIAGNOSIS — I739 Peripheral vascular disease, unspecified: Secondary | ICD-10-CM | POA: Diagnosis not present

## 2019-08-07 DIAGNOSIS — I6529 Occlusion and stenosis of unspecified carotid artery: Secondary | ICD-10-CM | POA: Diagnosis not present

## 2019-08-07 DIAGNOSIS — F4320 Adjustment disorder, unspecified: Secondary | ICD-10-CM | POA: Diagnosis not present

## 2019-08-07 DIAGNOSIS — Z1389 Encounter for screening for other disorder: Secondary | ICD-10-CM | POA: Diagnosis not present

## 2019-08-07 DIAGNOSIS — Z0000 Encounter for general adult medical examination without abnormal findings: Secondary | ICD-10-CM | POA: Diagnosis not present

## 2019-08-08 DIAGNOSIS — Z1212 Encounter for screening for malignant neoplasm of rectum: Secondary | ICD-10-CM | POA: Diagnosis not present

## 2019-12-26 ENCOUNTER — Encounter: Payer: Self-pay | Admitting: Cardiovascular Disease

## 2019-12-26 ENCOUNTER — Ambulatory Visit: Payer: BLUE CROSS/BLUE SHIELD | Admitting: Cardiovascular Disease

## 2019-12-26 ENCOUNTER — Other Ambulatory Visit: Payer: Self-pay

## 2019-12-26 VITALS — BP 92/58 | HR 64 | Ht 63.0 in | Wt 126.8 lb

## 2019-12-26 DIAGNOSIS — I350 Nonrheumatic aortic (valve) stenosis: Secondary | ICD-10-CM

## 2019-12-26 DIAGNOSIS — Q231 Congenital insufficiency of aortic valve: Secondary | ICD-10-CM | POA: Insufficient documentation

## 2019-12-26 DIAGNOSIS — R0989 Other specified symptoms and signs involving the circulatory and respiratory systems: Secondary | ICD-10-CM

## 2019-12-26 DIAGNOSIS — Q2381 Bicuspid aortic valve: Secondary | ICD-10-CM

## 2019-12-26 DIAGNOSIS — E782 Mixed hyperlipidemia: Secondary | ICD-10-CM

## 2019-12-26 DIAGNOSIS — R198 Other specified symptoms and signs involving the digestive system and abdomen: Secondary | ICD-10-CM | POA: Insufficient documentation

## 2019-12-26 NOTE — Patient Instructions (Signed)
Medication Instructions:  Your physician recommends that you continue on your current medications as directed. Please refer to the Current Medication list given to you today.  *If you need a refill on your cardiac medications before your next appointment, please call your pharmacy*   Lab Work: None Ordered If you have labs (blood work) drawn today and your tests are completely normal, you will receive your results only by: Marland Kitchen MyChart Message (if you have MyChart) OR . A paper copy in the mail If you have any lab test that is abnormal or we need to change your treatment, we will call you to review the results.   Testing/Procedures: Your physician has requested that you have an abdominal aorta duplex. During this test, an ultrasound is used to evaluate the aorta. Allow 30 minutes for this exam. Do not eat after midnight the day before and avoid carbonated beverages  Your physician has requested that you have a carotid duplex. This test is an ultrasound of the carotid arteries in your neck. It looks at blood flow through these arteries that supply the brain with blood. Allow one hour for this exam. There are no restrictions or special instructions.    Follow-Up: At [facility], you and your health needs are our priority.  As part of our continuing mission to provide you with exceptional heart care, we have created designated Provider Care Teams.  These Care Teams include your primary Cardiologist (physician) and Advanced Practice Providers (APPs -  Physician Assistants and Nurse Practitioners) who all work together to provide you with the care you need, when you need it.   Your next appointment:   1 year(s)  The format for your next appointment:   In Person  Provider:   You may see Mertie Moores, MD or one of the following Advanced Practice Providers on your designated Care Team:    Richardson Dopp, PA-C  [place], [place]

## 2019-12-26 NOTE — Progress Notes (Signed)
Erin [name] Date of Birth  1955/08/26 [facility] [address]    [place], [place]  [address] [phone]  Fax  [phone]  Problem list 1. Hyperlipidemia 2. Functional bicuspid aortic valve  Erin [name] is a 54 with a hx of hyperlipidemia and a functional bicuspid aortic valve.  She has done well. Denies any chest pain or dyspnea.    Nov. 21, 2014:  Erin [name] is ding ok.  She is taking Crestor and not the mevacor as listed in med list.  No CP,  Anxious about her BP;.  She avoids salt .  BP is typicall 140s / 80's.  Nov. 24, 2015:  BP is a bit high.   Not eating any extra salt.   Has been taking some cold meds recently.  Has been exercising twice a week.   Boot camp.  No CP or dyspnea.   Nov. 30,  2016: Doing great. Boot camp twice a week .  Gets her labs at Dr. Silvestre Mesi office.   Nov. 28, 2017:  Doing well Still exercising .  No CP , no dyspnea.    Sept. 6, 2019:  Has been under lots of stress since the death of her husband , Mortimer Fries. Had a syncopal episode on July 24.      She was walking down the back steps.   Echocardiogram reveals normal left ventricular systolic function with EF of 55 to 60%.  She has a very mild aortic stenosis with a mean gradient of 6 mmHg.  The aortic valve was thought to be trileaflet.  Coronary CT angiogram revealed essentially normal coronary arteries.  Her coronary calcium score was 22 Agatson units    She has not been eating very well since probably passed away.  She is lost about 10 pounds.  Since her syncopal episode she has been eating a little bit better.  Family around.  She has not gotten any formal counseling.  I advised her that she may need some additional counseling.  Dec 26, 2019:  Erin [name] is seen today for follow-up visit.  She has a history of hyperlipidemia and is on Crestor and Zetia. She has a functional biscuspid AV  Feeling well.  No CP or dyspnea  No further episodes of syncope   She has had some chest pain in  the past.  Coronary CT angiogram in 2019 revealed essentially normal coronary arteries.  She has a coronary calcium score of 22.  She saw Dr. Haynes Kerns in December, 2020.  At that time her lipid panel revealed an LDL of 88.  The HDL is 55.  Total cholesterol is 156.  The triglyceride level 65.  Current Outpatient Medications  Medication Sig Dispense Refill  . aspirin 81 MG tablet Take 81 mg by mouth once a week. Once a week    . benazepril (LOTENSIN) 20 MG tablet Take 1 tablet (20 mg total) by mouth daily. 30 tablet 6  . escitalopram (LEXAPRO) 10 MG tablet Take 10 mg by mouth daily.    Marland Kitchen ezetimibe (ZETIA) 10 MG tablet Take 10 mg by mouth daily.    . rosuvastatin (CRESTOR) 20 MG tablet Take 20 mg by mouth daily.    . valACYclovir (VALTREX) 500 MG tablet Take 500 mg by mouth as needed.     No current facility-administered medications for this visit.    Allergies  Allergen Reactions  . Lipitor [Atorvastatin Calcium] Other (See Comments)    MUSCLE ACHES    Past Medical History:  Diagnosis Date  . Aortic valve disorder 03/10/2018  . Chest pain at rest, neg MI, no CAD on cardiac CTA 03/09/2018  . Coronary artery disease    mild, per cardiology note  . Dental crowns present   . Hyperlipidemia   . Hypertension    states under control with med., has been on med. x 12 yr.  . Tricuspid but functionally bicuspid aortic valve    congenital, per pt. - is checked yearly by cardiologist  . Trigger ring finger of left hand 10/2015    Past Surgical History:  Procedure Laterality Date  . ABDOMINAL HYSTERECTOMY     complete  . APPENDECTOMY    . CHOLECYSTECTOMY    . FOOT GANGLION EXCISION Left 04/09/2004  . INCISIONAL HERNIA REPAIR Right 04/28/2004   RUQ ventral  . MINOR IRRIGATION AND DEBRIDEMENT OF WOUND Left 07/13/2006   elbow  . OLECRANON BURSA EXCISION Left 02/02/2006  . ROTATOR CUFF REPAIR Bilateral   . TRIGGER FINGER RELEASE Left 11/13/2015   Procedure: LEFT RING FINGER RELEASE TRIGGER  FINGER/A-1 PULLEY;  Surgeon: Ninetta Lights, MD;  Location: [place];  Service: Orthopedics;  Laterality: Left;  . TRIGGER FINGER RELEASE Right 09/16/2016   Procedure: RIGHT TRIGGER FINGER RELEASE RING FINGER AND SMALL FINGER;  Surgeon: Ninetta Lights, MD;  Location: [place];  Service: Orthopedics;  Laterality: Right;  RIGHT TRIGGER FINGER RELEASE RING FINGER AND SMALL FINGER    Social History   Tobacco Use  Smoking Status Former Smoker  . Quit date: 06/11/2010  . Years since quitting: 9.5  Smokeless Tobacco Never Used    Social History   Substance and Sexual Activity  Alcohol Use No    Family History  Problem Relation Age of Onset  . Aneurysm Mother   . Cancer Father   . Heart attack Maternal Grandfather   . Stroke Sister   . Heart attack Brother 32       died    82 of Systems:  Reviewed in the HPI.  All other systems are negative.   Physical Exam: Blood pressure (!) 92/58, pulse 64, height 5\' 3"  (1.6 m), weight 126 lb 12 oz (57.5 kg), SpO2 95 %.  GEN:  Well nourished, well developed in no acute distress HEENT: Normal NECK: No JVD; bilateral carotid bruits, R > L  LYMPHATICS: No lymphadenopathy CARDIAC: RRR  , soft systolic murmur RESPIRATORY:  Clear to auscultation without rales, wheezing or rhonchi  ABDOMEN: ,  + pulsitile abdominal aorta  MUSCULOSKELETAL:  No edema; No deformity  SKIN: Warm and dry NEUROLOGIC:  Alert and oriented x 3   ECG:    Assessment / Plan:   1.  Bicuspid aortic valve.  She has a soft systolic murmur.  Her bicuspid valve was echoed 2 years ago.  Echo seems to be stable.  No changes.   2 . Carotid artery disease: She is had duplex scans in the past.  She has mild carotid disease by duplex scan in 2019.  We will repeat her carotid duplex scan.  3.  Pulsatile abdominal aorta: She has a pulsatile abdominal aorta.  She also has a bicuspid aortic valve.  We will get an abdominal ultrasound.     .  4. Hyperlipidemia.        Labs from Dr. Silvestre Mesi office look good        Mertie Moores, MD  12/26/2019 11:29 AM    [facility] [address]  [address],  [place], [place]  [address] Pager [phone] Phone: [phone]; Fax: [phone]

## 2020-01-09 ENCOUNTER — Other Ambulatory Visit: Payer: Self-pay | Admitting: Cardiovascular Disease

## 2020-01-09 ENCOUNTER — Ambulatory Visit (HOSPITAL_COMMUNITY)
Admission: RE | Admit: 2020-01-09 | Discharge: 2020-01-09 | Disposition: A | Discharge status: Home / Self Care | Payer: BC Managed Care – PPO | Source: Ambulatory Visit | Attending: Cardiovascular Disease | Admitting: Cardiovascular Disease

## 2020-01-09 ENCOUNTER — Other Ambulatory Visit: Payer: Self-pay

## 2020-01-09 ENCOUNTER — Ambulatory Visit (HOSPITAL_BASED_OUTPATIENT_CLINIC_OR_DEPARTMENT_OTHER)
Admission: RE | Admit: 2020-01-09 | Discharge: 2020-01-09 | Disposition: A | Discharge status: Home / Self Care | Payer: BC Managed Care – PPO | Source: Ambulatory Visit | Attending: Cardiovascular Disease | Admitting: Cardiovascular Disease

## 2020-01-09 DIAGNOSIS — R0989 Other specified symptoms and signs involving the circulatory and respiratory systems: Secondary | ICD-10-CM | POA: Diagnosis not present

## 2020-01-09 DIAGNOSIS — Q231 Congenital insufficiency of aortic valve: Secondary | ICD-10-CM

## 2020-01-09 DIAGNOSIS — E782 Mixed hyperlipidemia: Secondary | ICD-10-CM | POA: Insufficient documentation

## 2020-01-09 DIAGNOSIS — R198 Other specified symptoms and signs involving the digestive system and abdomen: Secondary | ICD-10-CM | POA: Diagnosis not present

## 2020-01-09 DIAGNOSIS — I350 Nonrheumatic aortic (valve) stenosis: Secondary | ICD-10-CM

## 2020-06-03 DIAGNOSIS — Z1231 Encounter for screening mammogram for malignant neoplasm of breast: Secondary | ICD-10-CM | POA: Diagnosis not present

## 2020-06-30 DIAGNOSIS — Z85828 Personal history of other malignant neoplasm of skin: Secondary | ICD-10-CM | POA: Diagnosis not present

## 2020-06-30 DIAGNOSIS — L905 Scar conditions and fibrosis of skin: Secondary | ICD-10-CM | POA: Diagnosis not present

## 2020-06-30 DIAGNOSIS — D225 Melanocytic nevi of trunk: Secondary | ICD-10-CM | POA: Diagnosis not present

## 2020-06-30 DIAGNOSIS — L820 Inflamed seborrheic keratosis: Secondary | ICD-10-CM | POA: Diagnosis not present

## 2020-07-21 DIAGNOSIS — M25531 Pain in right wrist: Secondary | ICD-10-CM | POA: Diagnosis not present

## 2020-12-25 ENCOUNTER — Other Ambulatory Visit: Payer: Self-pay

## 2020-12-25 ENCOUNTER — Encounter: Payer: Self-pay | Admitting: Cardiovascular Disease

## 2020-12-25 ENCOUNTER — Ambulatory Visit (INDEPENDENT_AMBULATORY_CARE_PROVIDER_SITE_OTHER): Payer: 59 | Admitting: Cardiovascular Disease

## 2020-12-25 VITALS — BP 140/72 | HR 62 | Ht 62.5 in | Wt 130.0 lb

## 2020-12-25 DIAGNOSIS — E782 Mixed hyperlipidemia: Secondary | ICD-10-CM | POA: Diagnosis not present

## 2020-12-25 DIAGNOSIS — R0989 Other specified symptoms and signs involving the circulatory and respiratory systems: Secondary | ICD-10-CM

## 2020-12-25 DIAGNOSIS — Q231 Congenital insufficiency of aortic valve: Secondary | ICD-10-CM | POA: Diagnosis not present

## 2020-12-25 DIAGNOSIS — I350 Nonrheumatic aortic (valve) stenosis: Secondary | ICD-10-CM

## 2020-12-25 DIAGNOSIS — Q2381 Bicuspid aortic valve: Secondary | ICD-10-CM

## 2020-12-25 NOTE — Patient Instructions (Signed)
Medication Instructions:   Your physician recommends that you continue on your current medications as directed. Please refer to the Current Medication list given to you today.  *If you need a refill on your cardiac medications before your next appointment, please call your pharmacy*   Lab Work: none If you have labs (blood work) drawn today and your tests are completely normal, you will receive your results only by: Marland Kitchen MyChart Message (if you have MyChart) OR . A paper copy in the mail If you have any lab test that is abnormal or we need to change your treatment, we will call you to review the results.   Testing/Procedures: Your physician has requested that you have a carotid duplex. This test is an ultrasound of the carotid arteries in your neck. It looks at blood flow through these arteries that supply the brain with blood. Allow one hour for this exam. There are no restrictions or special instructions.   Follow-Up: At [facility], you and your health needs are our priority.  As part of our continuing mission to provide you with exceptional heart care, we have created designated Provider Care Teams.  These Care Teams include your primary Cardiologist (physician) and Advanced Practice Providers (APPs -  Physician Assistants and Nurse Practitioners) who all work together to provide you with the care you need, when you need it.   Your next appointment:   1 year(s)  The format for your next appointment:   In Person  Provider:   You may see Mertie Moores, MD or one of the following Advanced Practice Providers on your designated Care Team:    Richardson Dopp, PA-C  [place], [place]

## 2020-12-25 NOTE — Progress Notes (Signed)
Erin [name] Date of Birth  Nov 22, 1955 [facility] [address]    [place], [place]  [address] [phone]  Fax  [phone]  Problem list 1. Hyperlipidemia 2. Functional bicuspid aortic valve  Erin [name] is a 72 with a hx of hyperlipidemia and a functional bicuspid aortic valve.  She has done well. Denies any chest pain or dyspnea.    Nov. 21, 2014:  Erin [name] is ding ok.  She is taking Crestor and not the mevacor as listed in med list.  No CP,  Anxious about her BP;.  She avoids salt .  BP is typicall 140s / 80's.  Nov. 24, 2015:  BP is a bit high.   Not eating any extra salt.   Has been taking some cold meds recently.  Has been exercising twice a week.   Boot camp.  No CP or dyspnea.   Nov. 30,  2016: Doing great. Boot camp twice a week .  Gets her labs at Dr. Silvestre Mesi office.   Nov. 28, 2017:  Doing well Still exercising .  No CP , no dyspnea.    Sept. 6, 2019:  Has been under lots of stress since the death of her husband , Mortimer Fries. Had a syncopal episode on July 24.      She was walking down the back steps.   Echocardiogram reveals normal left ventricular systolic function with EF of 55 to 60%.  She has a very mild aortic stenosis with a mean gradient of 6 mmHg.  The aortic valve was thought to be trileaflet.  Coronary CT angiogram revealed essentially normal coronary arteries.  Her coronary calcium score was 22 Agatson units    She has not been eating very well since probably passed away.  She is lost about 10 pounds.  Since her syncopal episode she has been eating a little bit better.  Family around.  She has not gotten any formal counseling.  I advised her that she may need some additional counseling.  Dec 26, 2019:  Erin [name] is seen today for follow-up visit.  She has a history of hyperlipidemia and is on Crestor and Zetia. She has a functional biscuspid AV  Feeling well.  No CP or dyspnea  No further episodes of syncope   She has had some chest pain in  the past.  Coronary CT angiogram in 2019 revealed essentially normal coronary arteries.  She has a coronary calcium score of 22.  She saw Dr. Haynes Kerns in December, 2020.  At that time her lipid panel revealed an LDL of 88.  The HDL is 55.  Total cholesterol is 156.  The triglyceride level 65.  Dec 25, 2020: Erin [name] is seen today for follow up of her HLD Has a functional bicuspid AV Has retired .  Might be white coat HTN.  Still exercising   Avoids salt  BP at home looks good .    Current Outpatient Medications  Medication Sig Dispense Refill  . aspirin 81 MG tablet Take 81 mg by mouth once a week. Once a week    . benazepril (LOTENSIN) 20 MG tablet Take 1 tablet (20 mg total) by mouth daily. 30 tablet 6  . escitalopram (LEXAPRO) 10 MG tablet Take 10 mg by mouth daily.    Marland Kitchen ezetimibe (ZETIA) 10 MG tablet Take 10 mg by mouth daily.    . rosuvastatin (CRESTOR) 20 MG tablet Take 20 mg by mouth daily.    . valACYclovir (VALTREX) 500 MG tablet Take  500 mg by mouth as needed.     No current facility-administered medications for this visit.    Allergies  Allergen Reactions  . Lipitor [Atorvastatin Calcium] Other (See Comments)    MUSCLE ACHES    Past Medical History:  Diagnosis Date  . Aortic valve disorder 03/10/2018  . Chest pain at rest, neg MI, no CAD on cardiac CTA 03/09/2018  . Coronary artery disease    mild, per cardiology note  . Dental crowns present   . Hyperlipidemia   . Hypertension    states under control with med., has been on med. x 12 yr.  . Tricuspid but functionally bicuspid aortic valve    congenital, per pt. - is checked yearly by cardiologist  . Trigger ring finger of left hand 10/2015    Past Surgical History:  Procedure Laterality Date  . ABDOMINAL HYSTERECTOMY     complete  . APPENDECTOMY    . CHOLECYSTECTOMY    . FOOT GANGLION EXCISION Left 04/09/2004  . INCISIONAL HERNIA REPAIR Right 04/28/2004   RUQ ventral  . MINOR IRRIGATION AND DEBRIDEMENT OF WOUND  Left 07/13/2006   elbow  . OLECRANON BURSA EXCISION Left 02/02/2006  . ROTATOR CUFF REPAIR Bilateral   . TRIGGER FINGER RELEASE Left 11/13/2015   Procedure: LEFT RING FINGER RELEASE TRIGGER FINGER/A-1 PULLEY;  Surgeon: Ninetta Lights, MD;  Location: [place];  Service: Orthopedics;  Laterality: Left;  . TRIGGER FINGER RELEASE Right 09/16/2016   Procedure: RIGHT TRIGGER FINGER RELEASE RING FINGER AND SMALL FINGER;  Surgeon: Ninetta Lights, MD;  Location: [place];  Service: Orthopedics;  Laterality: Right;  RIGHT TRIGGER FINGER RELEASE RING FINGER AND SMALL FINGER    Social History   Tobacco Use  Smoking Status Former Smoker  . Quit date: 06/11/2010  . Years since quitting: 10.5  Smokeless Tobacco Never Used    Social History   Substance and Sexual Activity  Alcohol Use No    Family History  Problem Relation Age of Onset  . Aneurysm Mother   . Cancer Father   . Heart attack Maternal Grandfather   . Stroke Sister   . Heart attack Brother 96       died    40 of Systems:  Reviewed in the HPI.  All other systems are negative.   Physical Exam: Blood pressure 140/72, pulse 62, height 5' 2.5" (1.588 m), weight 130 lb (59 kg), SpO2 97 %.  GEN:  Well nourished, well developed in no acute distress HEENT: Normal NECK: No JVD; soft R carotid bruit.   LYMPHATICS: No lymphadenopathy CARDIAC: RRR  , soft systolic murmur  RESPIRATORY:  Clear to auscultation without rales, wheezing or rhonchi  ABDOMEN: Soft, non-tender, non-distended MUSCULOSKELETAL:  No edema; No deformity  SKIN: Warm and dry NEUROLOGIC:  Alert and oriented x 3   ECG: Dec 25, 2020: Normal sinus rhythm at 62.  No ST or T wave abnormalities.    Assessment / Plan:   1.  Bicuspid aortic valve.  Mild AS ,  Valve sounds great.  No changes.    2 . Carotid artery disease: She has mild bilateral carotid artery disease.  We will repeat her duplex scan.  3.  Pulsatile abdominal  aorta:     .  Stable.  4. Hyperlipidemia.        She has her labs drawn at Dr. Silvestre Mesi  office.  Labs look stable.   Will see her in 1 year  Mertie Moores, MD  12/25/2020 9:08 AM    [address],  [place], [place]  [address] Pager [phone] Phone: [phone]; Fax: [phone]

## 2020-12-31 ENCOUNTER — Encounter (HOSPITAL_COMMUNITY): Payer: Self-pay

## 2020-12-31 ENCOUNTER — Ambulatory Visit (HOSPITAL_COMMUNITY)
Admission: RE | Admit: 2020-12-31 | Payer: 59 | Source: Ambulatory Visit | Attending: Cardiovascular Disease | Admitting: Cardiovascular Disease

## 2021-05-01 ENCOUNTER — Encounter: Payer: Self-pay | Admitting: Internal Medicine

## 2021-07-02 ENCOUNTER — Other Ambulatory Visit: Payer: Self-pay

## 2021-07-02 ENCOUNTER — Ambulatory Visit (AMBULATORY_SURGERY_CENTER): Payer: 59 | Admitting: *Deleted

## 2021-07-02 VITALS — Ht 62.5 in | Wt 130.0 lb

## 2021-07-02 DIAGNOSIS — Z1211 Encounter for screening for malignant neoplasm of colon: Secondary | ICD-10-CM

## 2021-07-02 MED ORDER — PEG 3350-KCL-NA BICARB-NACL 420 G PO SOLR: 4000.0000 mL | Freq: Once | ORAL | 0 refills | Status: AC

## 2021-07-02 NOTE — Progress Notes (Signed)

## 2021-07-16 ENCOUNTER — Other Ambulatory Visit: Payer: Self-pay

## 2021-07-16 ENCOUNTER — Encounter: Payer: Self-pay | Admitting: Internal Medicine

## 2021-07-16 ENCOUNTER — Ambulatory Visit (AMBULATORY_SURGERY_CENTER): Payer: 59 | Admitting: Internal Medicine

## 2021-07-16 VITALS — BP 172/72 | HR 66 | Temp 97.5°F | Resp 18 | Ht 62.5 in | Wt 126.0 lb

## 2021-07-16 DIAGNOSIS — Z1211 Encounter for screening for malignant neoplasm of colon: Secondary | ICD-10-CM | POA: Diagnosis not present

## 2021-07-16 DIAGNOSIS — D120 Benign neoplasm of cecum: Secondary | ICD-10-CM | POA: Diagnosis not present

## 2021-07-16 MED ORDER — SODIUM CHLORIDE 0.9 % IV SOLN: 500.0000 mL | Freq: Once | INTRAVENOUS | Status: DC

## 2021-07-16 NOTE — Progress Notes (Signed)
Pt's states no medical or surgical changes since previsit or office visit. VS by CW. 

## 2021-07-16 NOTE — Progress Notes (Signed)
Report to PACU, RN, vss, BBS= Clear.  

## 2021-07-16 NOTE — Op Note (Signed)
[facility] Patient Name: Erin [name] Procedure Date: 07/16/2021 11:22 AM MRN: [id number] Endoscopist: Docia Chuck. Henrene Pastor , MD Age: 65 Referring MD:  Date of Birth: October 26, 1955 Gender: Female Account #: [id number] Procedure:                Colonoscopy with cold snare polypectomy x 1 Indications:              Screening for colorectal malignant neoplasm.                            Negative index exam October 2010 Medicines:                Monitored Anesthesia Care Procedure:                Pre-Anesthesia Assessment:                           - Prior to the procedure, a History and Physical                            was performed, and patient medications and                            allergies were reviewed. The patient's tolerance of                            previous anesthesia was also reviewed. The risks                            and benefits of the procedure and the sedation                            options and risks were discussed with the patient.                            All questions were answered, and informed consent                            was obtained. Prior Anticoagulants: The patient has                            taken no previous anticoagulant or antiplatelet                            agents. ASA Grade Assessment: II - A patient with                            mild systemic disease. After reviewing the risks                            and benefits, the patient was deemed in                            satisfactory condition to undergo the procedure.  After obtaining informed consent, the colonoscope                            was passed under direct vision. Throughout the                            procedure, the patient's blood pressure, pulse, and                            oxygen saturations were monitored continuously. The                            CF [id number] #[id number] was introduced through the anus                             and advanced to the the cecum, identified by                            appendiceal orifice and ileocecal valve. The                            ileocecal valve, appendiceal orifice, and rectum                            were photographed. The quality of the bowel                            preparation was excellent. The colonoscopy was                            performed without difficulty. The patient tolerated                            the procedure well. The bowel preparation used was                            SUPREP via split dose instruction. Scope In: 11:31:13 AM Scope Out: 11:43:53 AM Scope Withdrawal Time: 0 hours 9 minutes 39 seconds  Total Procedure Duration: 0 hours 12 minutes 40 seconds  Findings:                 A 2 mm polyp was found in the cecum. The polyp was                            removed with a cold snare. Resection and retrieval                            were complete.                           The exam was otherwise without abnormality on                            direct and retroflexion views. Complications:  No immediate complications. Estimated blood loss:                            None. Estimated Blood Loss:     Estimated blood loss: none. Impression:               - One 2 mm polyp in the cecum, removed with a cold                            snare. Resected and retrieved.                           - The examination was otherwise normal on direct                            and retroflexion views. Recommendation:           - Repeat colonoscopy in 7-10 years for surveillance.                           - Patient has a contact number available for                            emergencies. The signs and symptoms of potential                            delayed complications were discussed with the                            patient. Return to normal activities tomorrow.                            Written discharge instructions were provided to the                             patient.                           - Resume previous diet.                           - Continue present medications.                           - Await pathology results. Docia Chuck. Henrene Pastor, MD 07/16/2021 11:50:29 AM This report has been signed electronically.

## 2021-07-16 NOTE — Progress Notes (Signed)
HISTORY OF PRESENT ILLNESS:  Erin [name] is a 65 y.o. female who presents today for screening colonoscopy.  Index exam 2010 was normal.  No active complaints  REVIEW OF SYSTEMS:  All non-GI ROS negative.   Past Medical History:  Diagnosis Date   Allergy    Anxiety    2019 with loss of husband   Aortic valve disorder 03/10/2018   Chest pain at rest, neg MI, no CAD on cardiac CTA 03/09/2018   Coronary artery disease    mild, per cardiology note   Dental crowns present    Hyperlipidemia    Hypertension    states under control with med., has been on med. x 12 yr.   Tricuspid but functionally bicuspid aortic valve    congenital, per pt. - is checked yearly by cardiologist   Trigger ring finger of left hand 10/2015    Past Surgical History:  Procedure Laterality Date   ABDOMINAL HYSTERECTOMY     complete   APPENDECTOMY     CHOLECYSTECTOMY     COLONOSCOPY  05/30/2009   FOOT GANGLION EXCISION Left 04/09/2004   INCISIONAL HERNIA REPAIR Right 04/28/2004   RUQ ventral   MINOR IRRIGATION AND DEBRIDEMENT OF WOUND Left 07/13/2006   elbow   OLECRANON BURSA EXCISION Left 02/02/2006   ROTATOR CUFF REPAIR Bilateral    TRIGGER FINGER RELEASE Left 11/13/2015   Procedure: LEFT RING FINGER RELEASE TRIGGER FINGER/A-1 PULLEY;  Surgeon: Ninetta Lights, MD;  Location: [facility];  Service: Orthopedics;  Laterality: Left;   TRIGGER FINGER RELEASE Right 09/16/2016   Procedure: RIGHT TRIGGER FINGER RELEASE RING FINGER AND SMALL FINGER;  Surgeon: Ninetta Lights, MD;  Location: [facility];  Service: Orthopedics;  Laterality: Right;  RIGHT TRIGGER FINGER RELEASE RING FINGER AND SMALL FINGER    Social History Erin [name]  reports that she quit smoking about 11 years ago. Her smoking use included cigarettes. She has never used smokeless tobacco. She reports that she does not drink alcohol and does not use drugs.  family history includes Aneurysm in her mother; Cancer  in her father; Colon polyps in her sister; Heart attack in her maternal grandfather; Heart attack (age of onset: 31) in her brother; Stroke in her sister.  Allergies  Allergen Reactions   Lipitor [Atorvastatin Calcium] Other (See Comments)    MUSCLE ACHES       PHYSICAL EXAMINATION:  Vital signs: BP 129/77   Pulse 69   Temp (!) 97.5 F (36.4 C)   Ht 5' 2.5" (1.588 m)   Wt 126 lb (57.2 kg)   BMI 22.68 kg/m  General: Well-developed, well-nourished, no acute distress HEENT: Sclerae are anicteric, conjunctiva pink. Oral mucosa intact Lungs: Clear Heart: Regular Abdomen: soft, nontender, nondistended, no obvious ascites, no peritoneal signs, normal bowel sounds. No organomegaly. Extremities: No edema Psychiatric: alert and oriented x3. Cooperative     ASSESSMENT:  1.  Colon cancer screening.  Average risk   PLAN:   1.  Screening colonoscopy

## 2021-07-16 NOTE — Progress Notes (Signed)
Called to room to assist during endoscopic procedure.  Patient ID and intended procedure confirmed with present staff. Received instructions for my participation in the procedure from the performing physician.  

## 2021-07-16 NOTE — Patient Instructions (Signed)
Read all of the handouts given to you by your recovery room nurse. ? ?YOU HAD AN ENDOSCOPIC PROCEDURE TODAY AT THE [facility]:   Refer to the procedure report that was given to you for any specific questions about what was found during the examination.  If the procedure report does not answer your questions, please call your gastroenterologist to clarify.  If you requested that your care partner not be given the details of your procedure findings, then the procedure report has been included in a sealed envelope for you to review at your convenience later. ? ?YOU SHOULD EXPECT: Some feelings of bloating in the abdomen. Passage of more gas than usual.  Walking can help get rid of the air that was put into your GI tract during the procedure and reduce the bloating. If you had a lower endoscopy (such as a colonoscopy or flexible sigmoidoscopy) you may notice spotting of blood in your stool or on the toilet paper. If you underwent a bowel prep for your procedure, you may not have a normal bowel movement for a few days. ? ?Please Note:  You might notice some irritation and congestion in your nose or some drainage.  This is from the oxygen used during your procedure.  There is no need for concern and it should clear up in a day or so. ? ?SYMPTOMS TO REPORT IMMEDIATELY: ? ?Following lower endoscopy (colonoscopy or flexible sigmoidoscopy): ? Excessive amounts of blood in the stool ? Significant tenderness or worsening of abdominal pains ? Swelling of the abdomen that is new, acute ? Fever of 100?F or higher ? ?  ?For urgent or emergent issues, a gastroenterologist can be reached at any hour by calling [phone]. ?Do not use MyChart messaging for urgent concerns.  ? ? ?DIET:  We do recommend a small meal at first, but then you may proceed to your regular diet.  Drink plenty of fluids but you should avoid alcoholic beverages for 24 hours. ? ?ACTIVITY:  You should plan to take it easy for the rest of today  and you should NOT DRIVE or use heavy machinery until tomorrow (because of the sedation medicines used during the test).   ? ?FOLLOW UP: ?Our staff will call the number listed on your records 48-72 hours following your procedure to check on you and address any questions or concerns that you may have regarding the information given to you following your procedure. If we do not reach you, we will leave a message.  We will attempt to reach you two times.  During this call, we will ask if you have developed any symptoms of COVID 19. If you develop any symptoms (ie: fever, flu-like symptoms, shortness of breath, cough etc.) before then, please call [phone].  If you test positive for Covid 19 in the 2 weeks post procedure, please call and report this information to us.   ? ?If any biopsies were taken you will be contacted by phone or by letter within the next 1-3 weeks.  Please call us at [phone] if you have not heard about the biopsies in 3 weeks.  ? ? ?SIGNATURES/CONFIDENTIALITY: ?You and/or your care partner have signed paperwork which will be entered into your electronic medical record.  These signatures attest to the fact that that the information above on your After Visit Summary has been reviewed and is understood.  Full responsibility of the confidentiality of this discharge information lies with you and/or your care-partner.  ?

## 2021-07-20 ENCOUNTER — Telehealth: Payer: Self-pay | Admitting: *Deleted

## 2021-07-20 ENCOUNTER — Encounter: Payer: Self-pay | Admitting: Internal Medicine

## 2021-07-20 NOTE — Telephone Encounter (Signed)
  Follow up Call-  Call back number 07/16/2021  Post procedure Call Back phone  # [phone]  Permission to leave phone message Yes  Some recent data might be hidden     Patient questions:  Do you have a fever, pain , or abdominal swelling? No. Pain Score  0 *  Have you tolerated food without any problems? Yes.    Have you been able to return to your normal activities? Yes.    Do you have any questions about your discharge instructions: Diet   No. Medications  No. Follow up visit  No.  Do you have questions or concerns about your Care? No.  Actions: * If pain score is 4 or above: No action needed, pain <4.  Have you developed a fever since your procedure? no  2.   Have you had an respiratory symptoms (SOB or cough) since your procedure? no  3.   Have you tested positive for COVID 19 since your procedure no  4.   Have you had any family members/close contacts diagnosed with the COVID 19 since your procedure?  no   If yes to any of these questions please route to Joylene John, RN and Joella Prince, RN

## 2021-11-18 ENCOUNTER — Other Ambulatory Visit: Payer: Self-pay | Admitting: Internal Medicine

## 2021-11-18 DIAGNOSIS — F17200 Nicotine dependence, unspecified, uncomplicated: Secondary | ICD-10-CM

## 2021-12-01 ENCOUNTER — Ambulatory Visit
Admission: RE | Admit: 2021-12-01 | Discharge: 2021-12-01 | Disposition: A | Discharge status: Home / Self Care | Payer: Managed Care, Other (non HMO) | Source: Ambulatory Visit | Attending: Internal Medicine | Admitting: Internal Medicine

## 2021-12-01 DIAGNOSIS — F17200 Nicotine dependence, unspecified, uncomplicated: Secondary | ICD-10-CM

## 2021-12-25 ENCOUNTER — Other Ambulatory Visit (HOSPITAL_COMMUNITY): Payer: Self-pay | Admitting: Cardiovascular Disease

## 2021-12-25 DIAGNOSIS — R0989 Other specified symptoms and signs involving the circulatory and respiratory systems: Secondary | ICD-10-CM

## 2021-12-28 ENCOUNTER — Ambulatory Visit (HOSPITAL_COMMUNITY)
Admission: RE | Admit: 2021-12-28 | Discharge: 2021-12-28 | Disposition: A | Discharge status: Home / Self Care | Payer: Commercial Managed Care - HMO | Source: Ambulatory Visit | Attending: Internal Medicine | Admitting: Internal Medicine

## 2021-12-28 DIAGNOSIS — R0989 Other specified symptoms and signs involving the circulatory and respiratory systems: Secondary | ICD-10-CM | POA: Diagnosis not present

## 2022-03-03 ENCOUNTER — Encounter: Payer: Self-pay | Admitting: Cardiovascular Disease

## 2022-03-03 NOTE — Progress Notes (Signed)
Erin [name] Date of Birth  Sep 02, 1955 [facility] [address]    [place],   [address] [phone]  Fax  [phone]  Problem list 1. Hyperlipidemia 2. Functional bicuspid aortic valve  Erin [name] is a 56 with a hx of hyperlipidemia and a functional bicuspid aortic valve.  She has done well. Denies any chest pain or dyspnea.    Nov. 21, 2014:  Erin [name] is ding ok.  She is taking Crestor and not the mevacor as listed in med list.  No CP,  Anxious about her BP;.  She avoids salt .  BP is typicall 140s / 80's.  Nov. 24, 2015:  BP is a bit high.   Not eating any extra salt.   Has been taking some cold meds recently.  Has been exercising twice a week.   Boot camp.  No CP or dyspnea.   Nov. 30,  2016: Doing great. Boot camp twice a week .  Gets her labs at Dr. Silvestre Mesi office.   Nov. 28, 2017:  Doing well Still exercising .  No CP , no dyspnea.    Sept. 6, 2019:  Has been under lots of stress since the death of her husband , Mortimer Fries. Had a syncopal episode on July 24.      She was walking down the back steps.   Echocardiogram reveals normal left ventricular systolic function with EF of 55 to 60%.  She has a very mild aortic stenosis with a mean gradient of 6 mmHg.  The aortic valve was thought to be trileaflet.  Coronary CT angiogram revealed essentially normal coronary arteries.  Her coronary calcium score was 22 Agatson units    She has not been eating very well since probably passed away.  She is lost about 10 pounds.  Since her syncopal episode she has been eating a little bit better.  Family around.  She has not gotten any formal counseling.  I advised her that she may need some additional counseling.  Dec 26, 2019:  Erin [name] is seen today for follow-up visit.  She has a history of hyperlipidemia and is on Crestor and Zetia. She has a functional biscuspid AV  Feeling well.  No CP or dyspnea  No further episodes of syncope   She has had some chest pain in  the past.  Coronary CT angiogram in 2019 revealed essentially normal coronary arteries.  She has a coronary calcium score of 22.  She saw Dr. Haynes Kerns in December, 2020.  At that time her lipid panel revealed an LDL of 88.  The HDL is 55.  Total cholesterol is 156.  The triglyceride level 65.  Dec 25, 2020: Erin [name] is seen today for follow up of her HLD Has a functional bicuspid AV Has retired .  Might be white coat HTN.  Still exercising   Avoids salt  BP at home looks good .    March 05, 2022: Erin [name] seen today for follow-up of her hyperlipidemia and a functional bicuspid aortic valve.  She still exercising.  She tries to avoid salt.  Her blood pressure readings at home typically look better than at the office.  BP is a bit elevated today  Not as much exercise as she should  No CP , no dyspnea      Current Outpatient Medications  Medication Sig Dispense Refill   aspirin 81 MG tablet Take 81 mg by mouth once a week. Once a week     benazepril (LOTENSIN) 10  MG tablet Take 10 mg by mouth at bedtime.     escitalopram (LEXAPRO) 10 MG tablet Take 10 mg by mouth daily.     REPATHA SURECLICK 409 MG/ML SOAJ Inject 1 mL into the skin every 14 (fourteen) days.     rosuvastatin (CRESTOR) 20 MG tablet Take 20 mg by mouth daily.     valACYclovir (VALTREX) 500 MG tablet Take 500 mg by mouth daily.     No current facility-administered medications for this visit.    Allergies  Allergen Reactions   Lipitor [Atorvastatin Calcium] Other (See Comments)    MUSCLE ACHES    Past Medical History:  Diagnosis Date   Allergy    Anxiety    2019 with loss of husband   Aortic valve disorder 03/10/2018   Chest pain at rest, neg MI, no CAD on cardiac CTA 03/09/2018   Coronary artery disease    mild, per cardiology note   Dental crowns present    Hyperlipidemia    Hypertension    states under control with med., has been on med. x 12 yr.   Tricuspid but functionally bicuspid aortic valve    congenital,  per pt. - is checked yearly by cardiologist   Trigger ring finger of left hand 10/2015    Past Surgical History:  Procedure Laterality Date   ABDOMINAL HYSTERECTOMY     complete   APPENDECTOMY     CHOLECYSTECTOMY     COLONOSCOPY  05/30/2009   FOOT GANGLION EXCISION Left 04/09/2004   INCISIONAL HERNIA REPAIR Right 04/28/2004   RUQ ventral   MINOR IRRIGATION AND DEBRIDEMENT OF WOUND Left 07/13/2006   elbow   OLECRANON BURSA EXCISION Left 02/02/2006   ROTATOR CUFF REPAIR Bilateral    TRIGGER FINGER RELEASE Left 11/13/2015   Procedure: LEFT RING FINGER RELEASE TRIGGER FINGER/A-1 PULLEY;  Surgeon: Ninetta Lights, MD;  Location: [facility];  Service: Orthopedics;  Laterality: Left;   TRIGGER FINGER RELEASE Right 09/16/2016   Procedure: RIGHT TRIGGER FINGER RELEASE RING FINGER AND SMALL FINGER;  Surgeon: Ninetta Lights, MD;  Location: [facility];  Service: Orthopedics;  Laterality: Right;  RIGHT TRIGGER FINGER RELEASE RING FINGER AND SMALL FINGER    Social History   Tobacco Use  Smoking Status Former   Types: Cigarettes   Quit date: 06/11/2010   Years since quitting: 11.7  Smokeless Tobacco Never    Social History   Substance and Sexual Activity  Alcohol Use No    Family History  Problem Relation Age of Onset   Aneurysm Mother    Cancer Father    Colon polyps Sister    Stroke Sister    Heart attack Brother 28       died   Heart attack Maternal Grandfather    Colon cancer Neg Hx    Esophageal cancer Neg Hx    Rectal cancer Neg Hx    Stomach cancer Neg Hx     Reviw of Systems:  Reviewed in the HPI.  All other systems are negative.    Physical Exam: Blood pressure 130/80, pulse 68, height 5' 2.5" (1.588 m), weight 131 lb 9.6 oz (59.7 kg), SpO2 97 %.  GEN:  Well nourished, well developed in no acute distress HEENT: Normal NECK: No JVD; bilat carotid bruits  LYMPHATICS: No lymphadenopathy CARDIAC: RRR  soft systolic murmur , rubs,  gallops RESPIRATORY:  Clear to auscultation without rales, wheezing or rhonchi  ABDOMEN: Soft, non-tender, non-distended MUSCULOSKELETAL:  No edema;  No deformity  SKIN: Warm and dry NEUROLOGIC:  Alert and oriented x 3    ECG: March 05, 2022: Normal sinus rhythm at 68.  Possible left atrial enlargement.  Nonspecific ST changes    Assessment / Plan:   1.  Bicuspid aortic valve.   Has a functional bicuspid AV.   No CP or dyspnea.   Soft systolic murmur    2 . Carotid artery disease: Carotid duplex scan is stable.  We will recheck in 2 years.  3.  Pulsatile abdominal aorta:  stable    4. Hyperlipidemia.        Last lipids from several months ago look great.  Continue current medications.  She is now on Repatha and rosuvastatin.   Will see her in 1 year       Mertie Moores, MD  03/05/2022 2:08 PM    [facility] [place],  [place], [place]  [address] Pager [phone] Phone: [phone]; Fax: [phone]

## 2022-03-05 ENCOUNTER — Ambulatory Visit: Payer: Commercial Managed Care - HMO | Admitting: Cardiovascular Disease

## 2022-03-05 ENCOUNTER — Encounter: Payer: Self-pay | Admitting: Cardiovascular Disease

## 2022-03-05 VITALS — BP 130/80 | HR 68 | Ht 62.5 in | Wt 131.6 lb

## 2022-03-05 DIAGNOSIS — I359 Nonrheumatic aortic valve disorder, unspecified: Secondary | ICD-10-CM | POA: Diagnosis not present

## 2022-03-05 DIAGNOSIS — Q231 Congenital insufficiency of aortic valve: Secondary | ICD-10-CM | POA: Diagnosis not present

## 2022-03-05 DIAGNOSIS — R0989 Other specified symptoms and signs involving the circulatory and respiratory systems: Secondary | ICD-10-CM | POA: Diagnosis not present

## 2022-03-05 NOTE — Patient Instructions (Signed)
Medication Instructions:  Your physician recommends that you continue on your current medications as directed. Please refer to the Current Medication list given to you today.  *If you need a refill on your cardiac medications before your next appointment, please call your pharmacy*    Testing/Procedures: Your physician has requested that you have a carotid duplex in 2 years.  This test is an ultrasound of the carotid arteries in your neck. It looks at blood flow through these arteries that supply the brain with blood. Allow one hour for this exam. There are no restrictions or special instructions.    Follow-Up: At [facility], you and your health needs are our priority.  As part of our continuing mission to provide you with exceptional heart care, we have created designated Provider Care Teams.  These Care Teams include your primary Cardiologist (physician) and Advanced Practice Providers (APPs -  Physician Assistants and Nurse Practitioners) who all work together to provide you with the care you need, when you need it.   Your next appointment:   1 year(s)  The format for your next appointment:   In Person  Provider:   Mertie Moores, MD {  I

## 2023-03-20 ENCOUNTER — Encounter: Payer: Self-pay | Admitting: Cardiovascular Disease

## 2023-03-20 NOTE — Progress Notes (Unsigned)
Erin [name] Date of Birth  22-Nov-1955 [facility] [address]    [address] [place], [place]  [address] [phone]  Fax  [phone]  Problem list 1. Hyperlipidemia 2. Functional bicuspid aortic valve  Erin [name] is a 54 with a hx of hyperlipidemia and a functional bicuspid aortic valve.  She has done well. Denies any chest pain or dyspnea.    Nov. 21, 2014:  Erin [name] is ding ok.  She is taking Crestor and not the mevacor as listed in med list.  No CP,  Anxious about her BP;.  She avoids salt .  BP is typicall 140s / 80's.  Nov. 24, 2015:  BP is a bit high.   Not eating any extra salt.   Has been taking some cold meds recently.  Has been exercising twice a week.   Boot camp.  No CP or dyspnea.   Nov. 30,  2016: Doing great. Boot camp twice a week .  Gets her labs at Dr. Laurey Morale office.   Nov. 28, 2017:  Doing well Still exercising .  No CP , no dyspnea.    Sept. 6, 2019:  Has been under lots of stress since the death of her husband , Reita Cliche. Had a syncopal episode on July 24.      She was walking down the back steps.   Echocardiogram reveals normal left ventricular systolic function with EF of 55 to 60%.  She has a very mild aortic stenosis with a mean gradient of 6 mmHg.  The aortic valve was thought to be trileaflet.  Coronary CT angiogram revealed essentially normal coronary arteries.  Her coronary calcium score was 22 Agatson units    She has not been eating very well since probably passed away.  She is lost about 10 pounds.  Since her syncopal episode she has been eating a little bit better.  Family around.  She has not gotten any formal counseling.  I advised her that she may need some additional counseling.  Dec 26, 2019:  Erin [name] is seen today for follow-up visit.  She has a history of hyperlipidemia and is on Crestor and Zetia. She has a functional biscuspid AV  Feeling well.  No CP or dyspnea  No further episodes of syncope   She has had some chest pain in  the past.  Coronary CT angiogram in 2019 revealed essentially normal coronary arteries.  She has a coronary calcium score of 22.  She saw Dr. Sherrye Payor in December, 2020.  At that time her lipid panel revealed an LDL of 88.  The HDL is 55.  Total cholesterol is 156.  The triglyceride level 65.  Dec 25, 2020: Erin [name] is seen today for follow up of her HLD Has a functional bicuspid AV Has retired .  Might be white coat HTN.  Still exercising   Avoids salt  BP at home looks good .    March 05, 2022: Erin [name] seen today for follow-up of her hyperlipidemia and a functional bicuspid aortic valve.  She still exercising.  She tries to avoid salt.  Her blood pressure readings at home typically look better than at the office.  BP is a bit elevated today  Not as much exercise as she should  No CP , no dyspnea   Aug, 5, 2024 Erin [name] is seen today for follow up of her functional bicuspid AV, HLD   No Cp , no dyspnea   Avoids salt for the most part  Current Outpatient Medications  Medication Sig Dispense Refill   aspirin 81 MG tablet Take 81 mg by mouth once a week. Once a week     benazepril (LOTENSIN) 10 MG tablet Take 10 mg by mouth at bedtime.     escitalopram (LEXAPRO) 10 MG tablet Take 10 mg by mouth daily.     rosuvastatin (CRESTOR) 20 MG tablet Take 20 mg by mouth daily.     valACYclovir (VALTREX) 500 MG tablet Take 500 mg by mouth daily.     No current facility-administered medications for this visit.    Allergies  Allergen Reactions   Lipitor [Atorvastatin Calcium] Other (See Comments)    MUSCLE ACHES    Past Medical History:  Diagnosis Date   Allergy    Anxiety    2019 with loss of husband   Aortic valve disorder 03/10/2018   Chest pain at rest, neg MI, no CAD on cardiac CTA 03/09/2018   Coronary artery disease    mild, per cardiology note   Dental crowns present    Hyperlipidemia    Hypertension    states under control with med., has been on med. x 12 yr.   Tricuspid but  functionally bicuspid aortic valve    congenital, per pt. - is checked yearly by cardiologist   Trigger ring finger of left hand 10/2015    Past Surgical History:  Procedure Laterality Date   ABDOMINAL HYSTERECTOMY     complete   APPENDECTOMY     CHOLECYSTECTOMY     COLONOSCOPY  05/30/2009   FOOT GANGLION EXCISION Left 04/09/2004   INCISIONAL HERNIA REPAIR Right 04/28/2004   RUQ ventral   MINOR IRRIGATION AND DEBRIDEMENT OF WOUND Left 07/13/2006   elbow   OLECRANON BURSA EXCISION Left 02/02/2006   ROTATOR CUFF REPAIR Bilateral    TRIGGER FINGER RELEASE Left 11/13/2015   Procedure: LEFT RING FINGER RELEASE TRIGGER FINGER/A-1 PULLEY;  Surgeon: Loreta Ave, MD;  Location: [facility];  Service: Orthopedics;  Laterality: Left;   TRIGGER FINGER RELEASE Right 09/16/2016   Procedure: RIGHT TRIGGER FINGER RELEASE RING FINGER AND SMALL FINGER;  Surgeon: Loreta Ave, MD;  Location: [facility];  Service: Orthopedics;  Laterality: Right;  RIGHT TRIGGER FINGER RELEASE RING FINGER AND SMALL FINGER    Social History   Tobacco Use  Smoking Status Former   Current packs/day: 0.00   Types: Cigarettes   Quit date: 06/11/2010   Years since quitting: 12.7  Smokeless Tobacco Never    Social History   Substance and Sexual Activity  Alcohol Use No    Family History  Problem Relation Age of Onset   Aneurysm Mother    Cancer Father    Colon polyps Sister    Stroke Sister    Heart attack Brother 43       died   Heart attack Maternal Grandfather    Colon cancer Neg Hx    Esophageal cancer Neg Hx    Rectal cancer Neg Hx    Stomach cancer Neg Hx     Reviw of Systems:  Reviewed in the HPI.  All other systems are negative.   Physical Exam: Blood pressure 135/70, pulse 82, height 5\' 3"  (1.6 m), weight 136 lb 6.4 oz (61.9 kg), SpO2 97%.       GEN:  Well nourished, well developed in no acute distress HEENT: Normal NECK: No JVD; No carotid  bruits LYMPHATICS: No lymphadenopathy CARDIAC: RRR , soft systolic murmur  RESPIRATORY:  Clear to auscultation without rales, wheezing or rhonchi  ABDOMEN: Soft, non-tender, non-distended MUSCULOSKELETAL:  No edema; No deformity  SKIN: Warm and dry NEUROLOGIC:  Alert and oriented x 3     ECG:  EKG Interpretation Date/Time:  Monday March 21 2023 10:46:25 EDT Ventricular Rate:  77 PR Interval:  134 QRS Duration:  76 QT Interval:  376 QTC Calculation: 425 R Axis:   57  Text Interpretation: Normal sinus rhythm ST & T wave abnormality, consider lateral ischemia When compared with ECG of 10-Mar-2018 07:27, Vent. rate has increased BY  25 BPM Non-specific change in ST segment in Anterior leads Nonspecific T wave abnormality now evident in Inferior leads T wave inversion now evident in Lateral leads Confirmed by Kristeen Miss ([id number]) on 03/21/2023 11:12:54 AM      Assessment / Plan:   1.  Bicuspid aortic valve.   Has a functional bicuspid AV.    Stable .  Functions fairly well    2 . Carotid artery disease:  stable   3.  Pulsatile abdominal aorta:      4. Hyperlipidemia.        Managed by Dr Waynard Edwards  5.  ECG abn:   mild abnormalities :  no symptoms  Cor CTA from July 2019 showed no obstructive CAD . CAC score if 22  Will continue to follow u      Kristeen Miss, MD  03/21/2023 11:12 AM    [facility] [address] [place],  [address] [place], [place]  [address] Pager [phone] Phone: [phone]; Fax: [phone]

## 2023-03-21 ENCOUNTER — Encounter: Payer: Self-pay | Admitting: Cardiovascular Disease

## 2023-03-21 ENCOUNTER — Ambulatory Visit: Payer: Commercial Managed Care - HMO | Attending: Cardiovascular Disease | Admitting: Cardiovascular Disease

## 2023-03-21 VITALS — BP 135/70 | HR 82 | Ht 63.0 in | Wt 136.4 lb

## 2023-03-21 DIAGNOSIS — I359 Nonrheumatic aortic valve disorder, unspecified: Secondary | ICD-10-CM

## 2023-03-21 DIAGNOSIS — I10 Essential (primary) hypertension: Secondary | ICD-10-CM | POA: Diagnosis not present

## 2023-03-21 NOTE — Patient Instructions (Signed)
Medication Instructions:  Your physician recommends that you continue on your current medications as directed. Please refer to the Current Medication list given to you today.  *If you need a refill on your cardiac medications before your next appointment, please call your pharmacy*   Lab Work: NONE If you have labs (blood work) drawn today and your tests are completely normal, you will receive your results only by: MyChart Message (if you have MyChart) OR A paper copy in the mail If you have any lab test that is abnormal or we need to change your treatment, we will call you to review the results.   Testing/Procedures: NONE   Follow-Up: At [facility], you and your health needs are our priority.  As part of our continuing mission to provide you with exceptional heart care, we have created designated Provider Care Teams.  These Care Teams include your primary Cardiologist (physician) and Advanced Practice Providers (APPs -  Physician Assistants and Nurse Practitioners) who all work together to provide you with the care you need, when you need it.  We recommend signing up for the patient portal called "MyChart".  Sign up information is provided on this After Visit Summary.  MyChart is used to connect with patients for Virtual Visits (Telemedicine).  Patients are able to view lab/test results, encounter notes, upcoming appointments, etc.  Non-urgent messages can be sent to your provider as well.   To learn more about what you can do with MyChart, go to [email or web address].    Your next appointment:   1 year(s)  Provider:   Philip Nahser, MD      

## 2023-12-09 ENCOUNTER — Other Ambulatory Visit: Payer: Self-pay | Admitting: Internal Medicine

## 2023-12-09 DIAGNOSIS — Z0000 Encounter for general adult medical examination without abnormal findings: Secondary | ICD-10-CM

## 2023-12-26 ENCOUNTER — Ambulatory Visit
Admission: RE | Admit: 2023-12-26 | Discharge: 2023-12-26 | Disposition: A | Discharge status: Home / Self Care | Payer: Self-pay | Source: Ambulatory Visit | Attending: Internal Medicine | Admitting: Internal Medicine

## 2023-12-26 DIAGNOSIS — Z0000 Encounter for general adult medical examination without abnormal findings: Secondary | ICD-10-CM

## 2024-07-08 NOTE — Progress Notes (Signed)
 Cardiology Office Note:   Date:  07/08/2024  ID:  Erin [name], DOB [birth year], MRN [id number] PCP: Shayne Anes, MD  [facility] Providers Cardiologist:  Aleene Passe, MD (Inactive) { Chief Complaint: No chief complaint on file.     History of Present Illness:   Erin [name] is a 68 y.o. female with a PMH of functionally bicuspid AV, HTN, HLD, non-obstructive CAS who presents for follow up ***.   Past Medical History:  Diagnosis Date   Allergy    Anxiety    2019 with loss of husband   Aortic valve disorder 03/10/2018   Chest pain at rest, neg MI, no CAD on cardiac CTA 03/09/2018   Coronary artery disease    mild, per cardiology note   Dental crowns present    Hyperlipidemia    Hypertension    states under control with med., has been on med. x 12 yr.   Tricuspid but functionally bicuspid aortic valve    congenital, per pt. - is checked yearly by cardiologist   Trigger ring finger of left hand 10/2015     Studies Reviewed:    EKG: ***       Cardiac Studies & Procedures   ______________________________________________________________________________________________     ECHOCARDIOGRAM  ECHOCARDIOGRAM COMPLETE 03/10/2018  Narrative *[place]* *[facility]* [address] [place], [place] [address] [phone]  ------------------------------------------------------------------- Transthoracic Echocardiography  Patient:    Erin, [name] MR #:       [id number] Study Date: 03/10/2018 Gender:     F Age:        56 Height:     157.5 cm Weight:     52.6 kg BSA:        1.52 m^2 Pt. Status: Room:       2C15C  [name] REFERRING    [name] ADMITTING    Anes Parchment, M.D. ATTENDING    Anes Parchment, M.D. PERFORMING   [facility], Inpatient SONOGRAPHER  Chelsea Androw  cc:  ------------------------------------------------------------------- LV EF: 55% -    60%  ------------------------------------------------------------------- Indications:      Aortic stenosis 424.1.  ------------------------------------------------------------------- History:   PMH:  Bicuspid Aortic Valve  Chest pain.  Risk factors: History of tobacco use. Dyslipidemia.  ------------------------------------------------------------------- Study Conclusions  - Left ventricle: The cavity size was normal. Wall thickness was normal. Systolic function was normal. The estimated ejection fraction was in the range of 55% to 60%. Wall motion was normal; there were no regional wall motion abnormalities. Doppler parameters are consistent with abnormal left ventricular relaxation (grade 1 diastolic dysfunction).  Impressions:  - Normal LV function; mild diastolic dysfunction.  ------------------------------------------------------------------- Study data:  Comparison was made to the study of 08/02/2013.  Study status:  Routine.  Procedure:  The patient reported no pain pre or post test. Transthoracic echocardiography. Image quality was adequate.  Study completion:  There were no complications. Transthoracic echocardiography.  M-mode, complete 2D, spectral Doppler, and color Doppler.  Birthdate:  Patient birthdate: [birth year].  Age:  Patient is 68 yr old.  Sex:  Gender: female. BMI: 21.2 kg/m^2.  Blood pressure:     119/75  Patient status: Inpatient.  Study date:  Study date: 03/10/2018. Study time: 08:30 AM.  Location:  ICU/CCU  -------------------------------------------------------------------  ------------------------------------------------------------------- Left ventricle:  The cavity size was normal. Wall thickness was normal. Systolic function was normal. The estimated ejection fraction was in the range of 55% to 60%. Wall motion was normal;  there were no regional wall motion abnormalities. Doppler parameters are consistent with abnormal left ventricular  relaxation (grade 1 diastolic dysfunction).  ------------------------------------------------------------------- Aortic valve:   Trileaflet; mildly thickened leaflets. Mobility was not restricted.  Doppler:  Transvalvular velocity was within the normal range. There was no stenosis. There was no regurgitation. VTI ratio of LVOT to aortic valve: 0.7. Indexed valve area (VTI): 1.05 cm^2/m^2. Peak velocity ratio of LVOT to aortic valve: 0.67. Indexed valve area (Vmax): 0.99 cm^2/m^2. Mean velocity ratio of LVOT to aortic valve: 0.7. Indexed valve area (Vmean): 1.05 cm^2/m^2.    Mean gradient (S): 6 mm Hg. Peak gradient (S): 11 mm Hg.  ------------------------------------------------------------------- Aorta:  Aortic root: The aortic root was normal in size.  ------------------------------------------------------------------- Mitral valve:   Structurally normal valve.   Mobility was not restricted.  Doppler:  Transvalvular velocity was within the normal range. There was no evidence for stenosis. There was no regurgitation.    Valve area by pressure half-time: 4.68 cm^2. Indexed valve area by pressure half-time: 3.08 cm^2/m^2.    Peak gradient (D): 2 mm Hg.  ------------------------------------------------------------------- Left atrium:  The atrium was normal in size.  ------------------------------------------------------------------- Right ventricle:  The cavity size was normal. Systolic function was normal.  ------------------------------------------------------------------- Pulmonic valve:    Doppler:  Transvalvular velocity was within the normal range. There was no evidence for stenosis. There was trivial regurgitation.  ------------------------------------------------------------------- Tricuspid valve:   Structurally normal valve.    Doppler: Transvalvular velocity was within the normal range. There was trivial  regurgitation.  ------------------------------------------------------------------- Pulmonary artery:   Systolic pressure was within the normal range.  ------------------------------------------------------------------- Right atrium:  The atrium was normal in size.  ------------------------------------------------------------------- Pericardium:  There was no pericardial effusion.  ------------------------------------------------------------------- Systemic veins: Inferior vena cava: The vessel was normal in size.  ------------------------------------------------------------------- Measurements  Left ventricle                           Value          Reference LV ID, ED, PLAX chordal          (L)     41    mm       43 - 52 LV ID, ES, PLAX chordal                  27    mm       23 - 38 LV fx shortening, PLAX chordal           34    %        >=29 LV PW thickness, ED                      11    mm       ---------- IVS/LV PW ratio, ED                      1              <=1.3 Stroke volume, 2D                        57    ml       ---------- Stroke volume/bsa, 2D                    38    ml/m^2   ---------- LV e&', lateral  10.8  cm/s     ---------- LV E/e&', lateral                         7.29           ---------- LV e&', medial                            9.57  cm/s     ---------- LV E/e&', medial                          8.22           ---------- LV e&', average                           10.19 cm/s     ---------- LV E/e&', average                         7.73           ----------  Ventricular septum                       Value          Reference IVS thickness, ED                        11    mm       ----------  LVOT                                     Value          Reference LVOT ID, S                               17    mm       ---------- LVOT area                                2.27  cm^2     ---------- LVOT peak velocity, S                    112    cm/s     ---------- LVOT mean velocity, S                    76.7  cm/s     ---------- LVOT VTI, S                              25.3  cm       ---------- LVOT peak gradient, S                    5     mm Hg    ----------  Aortic valve                             Value          Reference Aortic valve peak velocity, S  168   cm/s     ---------- Aortic valve mean velocity, S            109   cm/s     ---------- Aortic valve VTI, S                      36.2  cm       ---------- Aortic mean gradient, S                  6     mm Hg    ---------- Aortic peak gradient, S                  11    mm Hg    ---------- VTI ratio, LVOT/AV                       0.7            ---------- Aortic valve area/bsa, VTI               1.05  cm^2/m^2 ---------- Velocity ratio, peak, LVOT/AV            0.67           ---------- Aortic valve area/bsa, peak              0.99  cm^2/m^2 ---------- velocity Velocity ratio, mean, LVOT/AV            0.7            ---------- Aortic valve area/bsa, mean              1.05  cm^2/m^2 ---------- velocity  Aorta                                    Value          Reference Aortic root ID, ED                       28    mm       ----------  Left atrium                              Value          Reference LA ID, A-P, ES                           30    mm       ---------- LA ID/bsa, A-P                           1.97  cm/m^2   <=2.2 LA volume, S                             54.9  ml       ---------- LA volume/bsa, S                         36.1  ml/m^2   ---------- LA volume, ES, 1-p A4C                   44    ml       ----------  LA volume/bsa, ES, 1-p A4C               29    ml/m^2   ---------- LA volume, ES, 1-p A2C                   55.9  ml       ---------- LA volume/bsa, ES, 1-p A2C               36.8  ml/m^2   ----------  Mitral valve                             Value          Reference Mitral E-wave peak velocity              78.7  cm/s      ---------- Mitral A-wave peak velocity              84.4  cm/s     ---------- Mitral deceleration time                 162   ms       150 - 230 Mitral pressure half-time                47    ms       ---------- Mitral peak gradient, D                  2     mm Hg    ---------- Mitral E/A ratio, peak                   0.9            ---------- Mitral valve area, PHT, DP               4.68  cm^2     ---------- Mitral valve area/bsa, PHT, DP           3.08  cm^2/m^2 ----------  Pulmonary arteries                       Value          Reference PA pressure, S, DP                       21    mm Hg    <=30  Tricuspid valve                          Value          Reference Tricuspid regurg peak velocity           214   cm/s     ---------- Tricuspid peak RV-RA gradient            18    mm Hg    ----------  Right atrium                             Value          Reference RA ID, S-I, ES, A4C              (H)     51    mm       34 - 49 RA area, ES, A4C  16.2  cm^2     8.3 - 19.5 RA volume, ES, A/L                       42.2  ml       ---------- RA volume/bsa, ES, A/L                   27.8  ml/m^2   ----------  Systemic veins                           Value          Reference Estimated CVP                            3     mm Hg    ----------  Right ventricle                          Value          Reference TAPSE                                    19.9  mm       ---------- RV pressure, S, DP                       21    mm Hg    <=30 RV s&', lateral, S                        10    cm/s     ----------  Legend: (L)  and  (H)  mark values outside specified reference range.  ------------------------------------------------------------------- Prepared and Electronically Authenticated by  Redell Shallow 2019-07-26T11:55:53    MONITORS  CARDIAC EVENT MONITOR 03/15/2018  Narrative  Normal sinus rhythm.  No significant arrhythmias   CT SCANS  CT CORONARY MORPH  W/CTA COR W/SCORE 03/09/2018  Addendum 03/09/2018  5:20 PM ADDENDUM REPORT: 03/09/2018 17:18  CLINICAL DATA:  Chest pain  EXAM: Cardiac CTA  MEDICATIONS: Sub lingual nitro.  4mg  x 2 and lopressor  5mg  IV  TECHNIQUE: The patient was scanned on a Siemens 192 slice scanner. Gantry rotation speed was 250 msecs. Collimation was 0.8 mm. A 100 kV prospective scan was triggered in the ascending thoracic aorta at 35-75% of the R-R interval. Average HR during the scan was 60 bpm. The 3D data set was interpreted on a dedicated work station using MPR, MIP and VRT modes. A total of 80cc of contrast was used.  FINDINGS: Non-cardiac: See separate report from [facility].  There was significant misregistration artifact but this did not significantly impede the ability to read the study.  Calcium  Score: 22 Agatston units.  Coronary Arteries: Right dominant with no anomalies  LM: No plaque or stenosis.  LAD system: Smalll area of calcified plaque proximal LAD, no significant stenosis.  Circumflex system: No plaque or stenosis.  RCA system: No plaque or stenosis.  IMPRESSION: 1. Coronary artery calcium  score 22 Agatston units, this places the patient in the 79th percentile for age and gender. This suggests moderate to high risk for future cardiac events.  2.  No obstructive coronary disease noted.  [employer]   Electronically Signed By: Dalton  Mclean  M.D. On: 03/09/2018 17:18  Narrative EXAM: OVER-READ INTERPRETATION  CT CHEST  The following report is an over-read performed by radiologist Dr. Jackquline Boxer of [facility] on 03/09/2018. This over-read does not include interpretation of cardiac or coronary anatomy or pathology. The coronary calcium  score/coronary CTA interpretation by the cardiologist is attached.  COMPARISON:  None.  FINDINGS: Limited view of the lung parenchyma demonstrates no suspicious nodularity. Airways are  normal.  Limited view of the mediastinum demonstrates no adenopathy. Esophagus normal.  Limited view of the upper abdomen unremarkable.  Limited view of the skeleton and chest wall is unremarkable.  IMPRESSION: No significant extracardiac findings.  Electronically Signed: By: Jackquline Boxer M.D. On: 03/09/2018 16:14     ______________________________________________________________________________________________      Risk Assessment/Calculations:   {Does this patient have ATRIAL FIBRILLATION?:[id number]} No BP recorded.  {Refresh Note OR Click here to enter BP  :1}***        Physical Exam:     VS:  There were no vitals taken for this visit. ***    Wt Readings from Last 3 Encounters:  03/21/23 136 lb 6.4 oz (61.9 kg)  03/05/22 131 lb 9.6 oz (59.7 kg)  07/16/21 126 lb (57.2 kg)     GEN: Well nourished, well developed, in no acute distress NECK: No JVD; No carotid bruits CARDIAC: ***RRR, no murmurs, rubs, gallops RESPIRATORY:  Clear to auscultation without rales, wheezing or rhonchi  ABDOMEN: Soft, non-tender, non-distended, normal bowel sounds EXTREMITIES:  Warm and well perfused, no edema; No deformity, 2+ radial pulses PSYCH: Normal mood and affect   Assessment & Plan       {Are you ordering a CV Procedure (e.g. stress test, cath, DCCV, TEE, etc)?   Press F2        :[id number]}   This note was written with the assistance of a dictation microphone or AI dictation software. Please excuse any typos or grammatical errors.   Signed, Georganna Archer, MD 07/08/2024 9:18 AM    [facility]

## 2024-07-09 ENCOUNTER — Other Ambulatory Visit (HOSPITAL_COMMUNITY): Payer: Self-pay

## 2024-07-09 ENCOUNTER — Ambulatory Visit
Attending: Student in an Organized Health Care Education/Training Program | Admitting: Student in an Organized Health Care Education/Training Program

## 2024-07-09 VITALS — BP 168/92 | HR 75 | Ht 63.0 in | Wt 130.0 lb

## 2024-07-09 DIAGNOSIS — Q2381 Bicuspid aortic valve: Secondary | ICD-10-CM | POA: Insufficient documentation

## 2024-07-09 DIAGNOSIS — I10 Essential (primary) hypertension: Secondary | ICD-10-CM | POA: Diagnosis not present

## 2024-07-09 DIAGNOSIS — I6523 Occlusion and stenosis of bilateral carotid arteries: Secondary | ICD-10-CM | POA: Insufficient documentation

## 2024-07-09 DIAGNOSIS — R0989 Other specified symptoms and signs involving the circulatory and respiratory systems: Secondary | ICD-10-CM | POA: Insufficient documentation

## 2024-07-09 DIAGNOSIS — E782 Mixed hyperlipidemia: Secondary | ICD-10-CM | POA: Insufficient documentation

## 2024-07-09 MED ORDER — ROSUVASTATIN CALCIUM 10 MG PO TABS
10.0000 mg | ORAL_TABLET | Freq: Every day | ORAL | 3 refills | Status: AC
  Filled 2024-07-09: qty 90, 90d supply, fill #0

## 2024-07-09 MED ORDER — BENAZEPRIL HCL 20 MG PO TABS
20.0000 mg | ORAL_TABLET | Freq: Every day | ORAL | 3 refills | Status: AC
  Filled 2024-07-09: qty 90, 90d supply, fill #0

## 2024-07-09 NOTE — Assessment & Plan Note (Signed)
-   BP is pretty elevated today. -We will increase her benazepril  dose. -I instructed the patient to keep a 2-week blood pressure log and then to send the results to me after she starts her increased dose of benazepril . Increase benazepril  to 20 mg daily 2-week blood pressure log Follow-up in 12 months

## 2024-07-09 NOTE — Assessment & Plan Note (Signed)
-   Recently stopped taking her Crestor  due to joint pains; however, she was on it for 10 years without side effects previously. - She is amenable to trying a lower dose to see if that is more tolerable. Reduce Crestor  dose to 10 mg daily (previously on 20 mg daily)

## 2024-07-09 NOTE — Assessment & Plan Note (Signed)
-   Known bicuspid aortic valve that is functional versus congenital. -Due for repeat echo for surveillance. Complete echo

## 2024-07-09 NOTE — Patient Instructions (Addendum)
 Medication Instructions:  INCREASE Benazepril  to 20 mg daily   DECREASE Crestor  10 mg daily   *If you need a refill on your cardiac medications before your next appointment, please call your pharmacy*  Testing/Procedures: Echocardiogram  Your physician has requested that you have an echocardiogram. Echocardiography is a painless test that uses sound waves to create images of your heart. It provides your doctor with information about the size and shape of your heart and how well your heart's chambers and valves are working. This procedure takes approximately one hour. There are no restrictions for this procedure. Please do NOT wear cologne, perfume, aftershave, or lotions (deodorant is allowed). Please arrive 15 minutes prior to your appointment time.  Please note: We ask at that you not bring children with you during ultrasound (echo/ vascular) testing. Due to room size and safety concerns, children are not allowed in the ultrasound rooms during exams. Our front office staff cannot provide observation of children in our lobby area while testing is being conducted. An adult accompanying a patient to their appointment will only be allowed in the ultrasound room at the discretion of the ultrasound technician under special circumstances. We apologize for any inconvenience.  Carotid US  in 1 year (before 1 year follow up)  Your physician has requested that you have a carotid duplex. This test is an ultrasound of the carotid arteries in your neck. It looks at blood flow through these arteries that supply the brain with blood. Allow one hour for this exam. There are no restrictions or special instructions.    Follow-Up: At [facility], you and your health needs are our priority.  As part of our continuing mission to provide you with exceptional heart care, our providers are all part of one team.  This team includes your primary Cardiologist (physician) and Advanced Practice Providers or APPs  (Physician Assistants and Nurse Practitioners) who all work together to provide you with the care you need, when you need it.  Your next appointment:   1 year(s)  Provider:   Georganna Archer, MD    PLEASE CHECK BLOOD PRESSURE TWICE DAILY FOR 2 WEEKS AND ADVISE RESULTS:  Blood Pressure Record Sheet To take your blood pressure, you will need a blood pressure machine. You can buy a blood pressure machine (blood pressure monitor) at your clinic, drug store, or online. When choosing one, consider: An automatic monitor that has an arm cuff. A cuff that wraps snugly around your upper arm. You should be able to fit only one finger between your arm and the cuff. A device that stores blood pressure reading results. Do not choose a monitor that measures your blood pressure from your wrist or finger. Follow your health care provider's instructions for how to take your blood pressure. To use this form: Take your blood pressure medications every day These measurements should be taken when you have been at rest for at least 10-15 min Take at least 2 readings with each blood pressure check. This makes sure the results are correct. Wait 1-2 minutes between measurements. Write down the results in the spaces on this form. Keep in mind it should always be recorded systolic over diastolic. Both numbers are important.  Repeat this every day for 2-3 weeks, or as told by your health care provider.  Make a follow-up appointment with your health care provider to discuss the results.  Blood Pressure Log Date Medications taken? (Y/N) Blood Pressure Time of Day

## 2025-07-08 ENCOUNTER — Ambulatory Visit (HOSPITAL_COMMUNITY)
